# Patient Record
Sex: Male | Born: 1950 | Race: White | Hispanic: No | Marital: Single | Smoking: Never smoker
Health system: Southern US, Community
[De-identification: ages and names within clinical notes are randomized; demographics above are authoritative.]

## PROBLEM LIST (undated history)

## (undated) DIAGNOSIS — R0609 Other forms of dyspnea: Secondary | ICD-10-CM

## (undated) DIAGNOSIS — F5104 Psychophysiologic insomnia: Secondary | ICD-10-CM

## (undated) DIAGNOSIS — C4490 Unspecified malignant neoplasm of skin, unspecified: Secondary | ICD-10-CM

## (undated) DIAGNOSIS — I2510 Atherosclerotic heart disease of native coronary artery without angina pectoris: Secondary | ICD-10-CM

## (undated) DIAGNOSIS — I700 Atherosclerosis of aorta: Secondary | ICD-10-CM

## (undated) DIAGNOSIS — S22000A Wedge compression fracture of unspecified thoracic vertebra, initial encounter for closed fracture: Secondary | ICD-10-CM

## (undated) DIAGNOSIS — H8120 Vestibular neuronitis, unspecified ear: Secondary | ICD-10-CM

## (undated) DIAGNOSIS — M5136 Other intervertebral disc degeneration, lumbar region: Secondary | ICD-10-CM

## (undated) DIAGNOSIS — Z7982 Long term (current) use of aspirin: Secondary | ICD-10-CM

## (undated) DIAGNOSIS — M47816 Spondylosis without myelopathy or radiculopathy, lumbar region: Secondary | ICD-10-CM

## (undated) DIAGNOSIS — C61 Malignant neoplasm of prostate: Secondary | ICD-10-CM

## (undated) DIAGNOSIS — K4090 Unilateral inguinal hernia, without obstruction or gangrene, not specified as recurrent: Secondary | ICD-10-CM

## (undated) DIAGNOSIS — K922 Gastrointestinal hemorrhage, unspecified: Secondary | ICD-10-CM

## (undated) DIAGNOSIS — I209 Angina pectoris, unspecified: Secondary | ICD-10-CM

## (undated) DIAGNOSIS — K219 Gastro-esophageal reflux disease without esophagitis: Secondary | ICD-10-CM

## (undated) DIAGNOSIS — I10 Essential (primary) hypertension: Secondary | ICD-10-CM

## (undated) DIAGNOSIS — E785 Hyperlipidemia, unspecified: Secondary | ICD-10-CM

## (undated) DIAGNOSIS — N529 Male erectile dysfunction, unspecified: Secondary | ICD-10-CM

## (undated) DIAGNOSIS — N419 Inflammatory disease of prostate, unspecified: Secondary | ICD-10-CM

## (undated) DIAGNOSIS — L570 Actinic keratosis: Secondary | ICD-10-CM

## (undated) DIAGNOSIS — R972 Elevated prostate specific antigen [PSA]: Secondary | ICD-10-CM

## (undated) DIAGNOSIS — D649 Anemia, unspecified: Secondary | ICD-10-CM

## (undated) DIAGNOSIS — I6521 Occlusion and stenosis of right carotid artery: Secondary | ICD-10-CM

## (undated) DIAGNOSIS — M51369 Other intervertebral disc degeneration, lumbar region without mention of lumbar back pain or lower extremity pain: Secondary | ICD-10-CM

## (undated) DIAGNOSIS — I4510 Unspecified right bundle-branch block: Secondary | ICD-10-CM

## (undated) DIAGNOSIS — R0600 Dyspnea, unspecified: Secondary | ICD-10-CM

## (undated) HISTORY — PX: OTHER SURGICAL HISTORY: SHX169

## (undated) HISTORY — PX: CARDIAC CATHETERIZATION: SHX172

## (undated) HISTORY — PX: CATARACT EXTRACTION: SUR2

## (undated) HISTORY — PX: RETINAL DETACHMENT SURGERY: SHX105

## (undated) HISTORY — PX: PROSTATECTOMY: SHX69

---
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
## 1998-03-26 DIAGNOSIS — I2510 Atherosclerotic heart disease of native coronary artery without angina pectoris: Secondary | ICD-10-CM

## 1998-03-26 HISTORY — DX: Atherosclerotic heart disease of native coronary artery without angina pectoris: I25.10

## 1998-04-15 HISTORY — PX: CORONARY ANGIOPLASTY WITH STENT PLACEMENT: SHX49

## 2006-06-27 ENCOUNTER — Ambulatory Visit: Payer: Self-pay | Admitting: Cardiology

## 2006-08-09 ENCOUNTER — Ambulatory Visit: Payer: Self-pay | Admitting: Cardiology

## 2010-05-22 ENCOUNTER — Ambulatory Visit: Payer: Self-pay | Admitting: Internal Medicine

## 2010-05-25 ENCOUNTER — Ambulatory Visit: Payer: Self-pay | Admitting: Internal Medicine

## 2010-11-27 ENCOUNTER — Inpatient Hospital Stay: Payer: Self-pay | Admitting: Internal Medicine

## 2010-12-03 LAB — PATHOLOGY REPORT

## 2010-12-31 ENCOUNTER — Ambulatory Visit: Payer: Self-pay | Admitting: Internal Medicine

## 2011-01-25 ENCOUNTER — Ambulatory Visit: Payer: Self-pay | Admitting: Gastroenterology

## 2011-09-28 ENCOUNTER — Ambulatory Visit: Payer: Self-pay | Admitting: Internal Medicine

## 2012-10-12 ENCOUNTER — Emergency Department: Payer: Self-pay | Admitting: Emergency Medicine

## 2013-02-19 ENCOUNTER — Emergency Department: Payer: Self-pay | Admitting: Emergency Medicine

## 2013-02-19 LAB — URINALYSIS, COMPLETE
BILIRUBIN, UR: NEGATIVE
Bacteria: NONE SEEN
Blood: NEGATIVE
Glucose,UR: NEGATIVE mg/dL (ref 0–75)
Hyaline Cast: 11
Ketone: NEGATIVE
Leukocyte Esterase: NEGATIVE
Nitrite: NEGATIVE
PH: 5 (ref 4.5–8.0)
Protein: NEGATIVE
Specific Gravity: 1.018 (ref 1.003–1.030)

## 2013-02-19 LAB — COMPREHENSIVE METABOLIC PANEL
ALBUMIN: 4.4 g/dL (ref 3.4–5.0)
ALT: 45 U/L (ref 12–78)
Alkaline Phosphatase: 71 U/L
Anion Gap: 3 — ABNORMAL LOW (ref 7–16)
BILIRUBIN TOTAL: 0.9 mg/dL (ref 0.2–1.0)
BUN: 21 mg/dL — AB (ref 7–18)
CO2: 29 mmol/L (ref 21–32)
CREATININE: 1.03 mg/dL (ref 0.60–1.30)
Calcium, Total: 9.3 mg/dL (ref 8.5–10.1)
Chloride: 102 mmol/L (ref 98–107)
EGFR (African American): >60
EGFR (Non-African Amer.): >60
GLUCOSE: 105 mg/dL — AB (ref 65–99)
Osmolality: 272 (ref 275–301)
Potassium: 3.9 mmol/L (ref 3.5–5.1)
SGOT(AST): 59 U/L — ABNORMAL HIGH (ref 15–37)
Sodium: 134 mmol/L — ABNORMAL LOW (ref 136–145)
TOTAL PROTEIN: 8 g/dL (ref 6.4–8.2)

## 2013-02-19 LAB — CBC
HCT: 43.1 % (ref 40.0–52.0)
HGB: 14.9 g/dL (ref 13.0–18.0)
MCH: 31.3 pg (ref 26.0–34.0)
MCHC: 34.5 g/dL (ref 32.0–36.0)
MCV: 91 fL (ref 80–100)
Platelet: 162 10*3/uL (ref 150–440)
RBC: 4.74 10*6/uL (ref 4.40–5.90)
RDW: 13.1 % (ref 11.5–14.5)
WBC: 12.9 10*3/uL — AB (ref 3.8–10.6)

## 2013-02-19 LAB — CK TOTAL AND CKMB (NOT AT ARMC)
CK, Total: 227 U/L (ref 35–232)
CK-MB: 1 ng/mL (ref 0.5–3.6)

## 2013-02-19 LAB — TROPONIN I: Troponin-I: <0.02 ng/mL

## 2013-03-30 ENCOUNTER — Ambulatory Visit: Payer: Self-pay | Admitting: Internal Medicine

## 2014-01-31 ENCOUNTER — Ambulatory Visit: Payer: Self-pay | Admitting: Internal Medicine

## 2015-01-26 DIAGNOSIS — C61 Malignant neoplasm of prostate: Secondary | ICD-10-CM

## 2015-01-26 HISTORY — DX: Malignant neoplasm of prostate: C61

## 2016-11-16 ENCOUNTER — Other Ambulatory Visit: Payer: Self-pay | Admitting: Chiropractic Medicine

## 2016-11-16 DIAGNOSIS — M79605 Pain in left leg: Secondary | ICD-10-CM

## 2016-11-16 DIAGNOSIS — M545 Low back pain: Secondary | ICD-10-CM

## 2016-11-19 ENCOUNTER — Ambulatory Visit
Admission: RE | Admit: 2016-11-19 | Discharge: 2016-11-19 | Disposition: A | Discharge status: Home / Self Care | Payer: Medicare Other | Source: Ambulatory Visit | Attending: Chiropractic Medicine | Admitting: Chiropractic Medicine

## 2016-11-19 DIAGNOSIS — M47816 Spondylosis without myelopathy or radiculopathy, lumbar region: Secondary | ICD-10-CM | POA: Diagnosis not present

## 2016-11-19 DIAGNOSIS — M5137 Other intervertebral disc degeneration, lumbosacral region: Secondary | ICD-10-CM | POA: Insufficient documentation

## 2016-11-19 DIAGNOSIS — M545 Low back pain, unspecified: Secondary | ICD-10-CM

## 2016-11-19 DIAGNOSIS — M419 Scoliosis, unspecified: Secondary | ICD-10-CM | POA: Insufficient documentation

## 2018-08-01 ENCOUNTER — Other Ambulatory Visit
Admission: RE | Admit: 2018-08-01 | Discharge: 2018-08-01 | Disposition: A | Discharge status: Home / Self Care | Payer: Medicare Other | Source: Ambulatory Visit | Attending: Gastroenterology | Admitting: Gastroenterology

## 2018-08-01 ENCOUNTER — Other Ambulatory Visit: Payer: Self-pay

## 2018-08-01 DIAGNOSIS — Z01812 Encounter for preprocedural laboratory examination: Secondary | ICD-10-CM | POA: Diagnosis present

## 2018-08-01 DIAGNOSIS — Z1159 Encounter for screening for other viral diseases: Secondary | ICD-10-CM | POA: Insufficient documentation

## 2018-08-01 LAB — SARS CORONAVIRUS 2 (TAT 6-24 HRS): SARS Coronavirus 2: NEGATIVE

## 2018-08-04 ENCOUNTER — Ambulatory Visit: Payer: Medicare Other | Admitting: Anesthesiology

## 2018-08-04 ENCOUNTER — Other Ambulatory Visit: Payer: Self-pay

## 2018-08-04 ENCOUNTER — Encounter: Payer: Self-pay | Admitting: Anesthesiology

## 2018-08-04 ENCOUNTER — Ambulatory Visit
Admission: RE | Admit: 2018-08-04 | Discharge: 2018-08-04 | Disposition: A | Discharge status: Home / Self Care | Payer: Medicare Other | Attending: Gastroenterology | Admitting: Gastroenterology

## 2018-08-04 ENCOUNTER — Encounter
Admission: RE | Disposition: A | Discharge status: Home / Self Care | Payer: Self-pay | Source: Home / Self Care | Attending: Gastroenterology

## 2018-08-04 DIAGNOSIS — Z8711 Personal history of peptic ulcer disease: Secondary | ICD-10-CM | POA: Diagnosis not present

## 2018-08-04 DIAGNOSIS — K210 Gastro-esophageal reflux disease with esophagitis: Secondary | ICD-10-CM | POA: Insufficient documentation

## 2018-08-04 DIAGNOSIS — K2980 Duodenitis without bleeding: Secondary | ICD-10-CM | POA: Insufficient documentation

## 2018-08-04 DIAGNOSIS — K228 Other specified diseases of esophagus: Secondary | ICD-10-CM | POA: Diagnosis not present

## 2018-08-04 DIAGNOSIS — Z79899 Other long term (current) drug therapy: Secondary | ICD-10-CM | POA: Insufficient documentation

## 2018-08-04 DIAGNOSIS — K224 Dyskinesia of esophagus: Secondary | ICD-10-CM | POA: Insufficient documentation

## 2018-08-04 DIAGNOSIS — Z7982 Long term (current) use of aspirin: Secondary | ICD-10-CM | POA: Insufficient documentation

## 2018-08-04 DIAGNOSIS — R0789 Other chest pain: Secondary | ICD-10-CM | POA: Insufficient documentation

## 2018-08-04 DIAGNOSIS — K449 Diaphragmatic hernia without obstruction or gangrene: Secondary | ICD-10-CM | POA: Insufficient documentation

## 2018-08-04 DIAGNOSIS — I10 Essential (primary) hypertension: Secondary | ICD-10-CM | POA: Diagnosis not present

## 2018-08-04 HISTORY — PX: ESOPHAGOGASTRODUODENOSCOPY (EGD) WITH PROPOFOL: SHX5813

## 2018-08-04 SURGERY — ESOPHAGOGASTRODUODENOSCOPY (EGD) WITH PROPOFOL
Anesthesia: General

## 2018-08-04 MED ORDER — SODIUM CHLORIDE 0.9 % IV SOLN
INTRAVENOUS | Status: DC
  Administered 2018-08-04: 07:00:00 1000 mL via INTRAVENOUS

## 2018-08-04 MED ORDER — LIDOCAINE HCL (CARDIAC) PF 100 MG/5ML IV SOSY
PREFILLED_SYRINGE | INTRAVENOUS | Status: DC | PRN
  Administered 2018-08-04: 50 mg via INTRATRACHEAL

## 2018-08-04 MED ORDER — PROPOFOL 500 MG/50ML IV EMUL
INTRAVENOUS | Status: AC
  Filled 2018-08-04: qty 50

## 2018-08-04 MED ORDER — LIDOCAINE HCL (PF) 2 % IJ SOLN
INTRAMUSCULAR | Status: AC
  Filled 2018-08-04: qty 10

## 2018-08-04 MED ORDER — PROPOFOL 10 MG/ML IV BOLUS
INTRAVENOUS | Status: DC | PRN
  Administered 2018-08-04: 100 mg via INTRAVENOUS

## 2018-08-04 MED ORDER — GLYCOPYRROLATE 0.2 MG/ML IJ SOLN
INTRAMUSCULAR | Status: DC | PRN
  Administered 2018-08-04: 0.2 mg via INTRAVENOUS

## 2018-08-04 MED ORDER — PROPOFOL 500 MG/50ML IV EMUL
INTRAVENOUS | Status: DC | PRN
  Administered 2018-08-04: 75 ug/kg/min via INTRAVENOUS

## 2018-08-04 NOTE — H&P (Signed)
Outpatient short stay form Pre-procedure 08/04/2018 7:30 AM Lollie Sails MD  Primary Physician: Ramonita Lab, MD  Reason for visit: Patient is a 68 year old male presenting today for an EGD in regards to a history of atypical chest pain.  He also has a history of GERD.  About 2 months ago he began to have rather acute onset of atypical chest pain.  This at times is very short and sharp other times more extended in the left lower chest.  It does not radiate.  Has had a cardiology evaluation which was negative.  He has been placed on a different PPI and increased dose to twice a day which has not appreciably affected the symptom.  However it seems to be rather musculoskeletal in nature.  It is more present when he lightly lays on his left side.  It does not occur when he lays on his right side or on his back.  He has no dysphagia and his reflux is well controlled.  Does have a history of a gastric ulcer number years ago.  He takes a daily 81 mg aspirin that was held this morning.  He does not take any NSAIDs otherwise or other aspirin products.  History of present illness: As noted above    Current Facility-Administered Medications:  .  0.9 %  sodium chloride infusion, , Intravenous, Continuous, Lollie Sails, MD, Last Rate: 20 mL/hr at 08/04/18 0718, 1,000 mL at 08/04/18 0093  Medications Prior to Admission  Medication Sig Dispense Refill Last Dose  . amLODipine (NORVASC) 5 MG tablet Take 5 mg by mouth daily.   08/04/2018 at 0545  . aspirin EC 81 MG tablet Take 81 mg by mouth daily.   08/03/2018 at Unknown time  . cyclobenzaprine (FLEXERIL) 10 MG tablet Take 10 mg by mouth at bedtime.   08/03/2018 at Unknown time  . losartan-hydrochlorothiazide (HYZAAR) 100-25 MG tablet Take 1 tablet by mouth daily.   08/04/2018 at 0545  . pantoprazole (PROTONIX) 40 MG tablet Take 40 mg by mouth 2 (two) times daily before a meal.   08/03/2018 at Unknown time  . senna (SENOKOT) 8.6 MG tablet Take 1 tablet by mouth  daily.   08/03/2018 at Unknown time  . sildenafil (REVATIO) 20 MG tablet Take 20 mg by mouth 5 (five) times daily.   Past Month at Unknown time  . simvastatin (ZOCOR) 40 MG tablet Take 40 mg by mouth daily.   08/03/2018 at Unknown time  . zolpidem (AMBIEN) 5 MG tablet Take 5 mg by mouth at bedtime as needed for sleep.   08/03/2018 at Unknown time     Not on File   History reviewed. No pertinent past medical history.  Review of systems:      Physical Exam    Heart and lungs: The rate and rhythm without rub or gallop lungs are bilaterally clear    HEENT: Cephalic atraumatic eyes are anicteric    Other:    Pertinant exam for procedure: Soft nontender nondistended bowel sounds positive normoactive.  Palpation to the indicated area in the lower left rib cage does not reproduce symptoms.    Planned proceedures: EGD and indicated procedures. I have discussed the risks benefits and complications of procedures to include not limited to bleeding, infection, perforation and the risk of sedation and the patient wishes to proceed.    Lollie Sails, MD Gastroenterology 08/04/2018  7:30 AM

## 2018-08-04 NOTE — Anesthesia Preprocedure Evaluation (Addendum)
Anesthesia Evaluation  Patient identified by MRN, date of birth, ID band Patient awake    Reviewed: Allergy & Precautions, NPO status , Patient's Chart, lab work & pertinent test results  Airway Mallampati: II  TM Distance: <3 FB     Dental  (+) Caps   Pulmonary    Pulmonary exam normal        Cardiovascular hypertension, Pt. on medications Normal cardiovascular exam     Neuro/Psych    GI/Hepatic Neg liver ROS, GERD  ,  Endo/Other  negative endocrine ROS  Renal/GU negative Renal ROS  negative genitourinary   Musculoskeletal   Abdominal Normal abdominal exam  (+)   Peds negative pediatric ROS (+)  Hematology negative hematology ROS (+)   Anesthesia Other Findings   Reproductive/Obstetrics                            Anesthesia Physical Anesthesia Plan  ASA: II  Anesthesia Plan: General   Post-op Pain Management:    Induction: Intravenous  PONV Risk Score and Plan:   Airway Management Planned: Nasal Cannula  Additional Equipment:   Intra-op Plan:   Post-operative Plan:   Informed Consent: I have reviewed the patients History and Physical, chart, labs and discussed the procedure including the risks, benefits and alternatives for the proposed anesthesia with the patient or authorized representative who has indicated his/her understanding and acceptance.     Dental advisory given  Plan Discussed with: CRNA and Surgeon  Anesthesia Plan Comments:         Anesthesia Quick Evaluation

## 2018-08-04 NOTE — Transfer of Care (Signed)
Immediate Anesthesia Transfer of Care Note  Patient: Terry Navarro.  Procedure(s) Performed: ESOPHAGOGASTRODUODENOSCOPY (EGD) WITH PROPOFOL (N/A )  Patient Location: PACU and Endoscopy Unit  Anesthesia Type:General  Level of Consciousness: drowsy  Airway & Oxygen Therapy: Patient connected to nasal cannula oxygen  Post-op Assessment: Report given to RN and Post -op Vital signs reviewed and stable  Post vital signs: Reviewed and stable  Last Vitals:  Vitals Value Taken Time  BP 103/71 08/04/18 0759  Temp 36.2 C 08/04/18 0759  Pulse 72 08/04/18 0801  Resp 19 08/04/18 0801  SpO2 99 % 08/04/18 0801  Vitals shown include unvalidated device data.  Last Pain:  Vitals:   08/04/18 0759  TempSrc: Tympanic  PainSc: 0-No pain         Complications: No apparent anesthesia complications

## 2018-08-04 NOTE — Op Note (Signed)
[facility] Gastroenterology Patient Name: Terry Navarro Procedure Date: 08/04/2018 7:37 AM MRN: [id number] Account #: [id number] Date of Birth: 11/27/1950 Admit Type: Outpatient Age: 68 Room: [facility] ENDO ROOM 1 Gender: Male Note Status: Finalized Procedure:            Upper GI endoscopy Indications:          Gastro-esophageal reflux disease, Unexplained chest pain Providers:            Lollie Sails, MD Medicines:            Monitored Anesthesia Care Complications:        No immediate complications. Procedure:            Pre-Anesthesia Assessment:                       - ASA Grade Assessment: II - A patient with mild                        systemic disease.                       After obtaining informed consent, the endoscope was                        passed under direct vision. Throughout the procedure,                        the patient's blood pressure, pulse, and oxygen                        saturations were monitored continuously. The Endoscope                        was introduced through the mouth, and advanced to the                        third part of duodenum. The upper GI endoscopy was                        accomplished without difficulty. The patient tolerated                        the procedure well. Findings:      The Z-line was variable. Biopsies were taken with a cold forceps for       histology.      Abnormal motility was noted in the lower third of the esophagus. The       cricopharyngeus was normal. There is spasticity of the esophageal body.       The distal esophagus/lower esophageal sphincter is open. Tertiary       peristaltic waves are noted.      The entire examined stomach was normal. Biopsies were taken with a cold       forceps for histology.      Patchy minimal inflammation characterized by erythema was found in the       duodenal bulb.      The exam of the duodenum was otherwise normal.      The cardia and gastric fundus  were normal on retroflexion.      A small hiatal hernia was found. The Z-line was a variable distance from  incisors; the hiatal hernia was sliding. Impression:           - Z-line variable. Biopsied.                       - Abnormal esophageal motility.                       - Normal stomach. Biopsied.                       - Duodenitis.                       - Small hiatal hernia. Recommendation:       - Use Protonix (pantoprazole) 40 mg PO daily daily.                       - Perform CT scan (computed tomography) of the chest                        with contrast at appointment to be scheduled. Procedure Code(s):    --- Professional ---                       [phone], Esophagogastroduodenoscopy, flexible, transoral;                        with biopsy, single or multiple Diagnosis Code(s):    --- Professional ---                       K22.8, Other specified diseases of esophagus                       K22.4, Dyskinesia of esophagus                       K29.80, Duodenitis without bleeding                       K44.9, Diaphragmatic hernia without obstruction or                        gangrene                       K21.9, Gastro-esophageal reflux disease without                        esophagitis                       R07.9, Chest pain, unspecified CPT copyright 2019 American Medical Association. All rights reserved. The codes documented in this report are preliminary and upon coder review may  be revised to meet current compliance requirements. Lollie Sails, MD 08/04/2018 8:01:19 AM This report has been signed electronically. Number of Addenda: 0 Note Initiated On: 08/04/2018 7:37 AM      [facility]

## 2018-08-04 NOTE — Anesthesia Procedure Notes (Signed)
Performed by: Babs Sciara, CRNA Oxygen Delivery Method: Nasal cannula

## 2018-08-04 NOTE — Anesthesia Post-op Follow-up Note (Signed)
Anesthesia QCDR form completed.        

## 2018-08-07 ENCOUNTER — Encounter: Payer: Self-pay | Admitting: Gastroenterology

## 2018-08-07 LAB — SURGICAL PATHOLOGY

## 2018-08-07 NOTE — Anesthesia Postprocedure Evaluation (Signed)
Anesthesia Post Note  Patient: Terry Navarro.  Procedure(s) Performed: ESOPHAGOGASTRODUODENOSCOPY (EGD) WITH PROPOFOL (N/A )  Patient location during evaluation: Endoscopy Anesthesia Type: General Level of consciousness: awake and alert and oriented Pain management: pain level controlled Vital Signs Assessment: post-procedure vital signs reviewed and stable Respiratory status: spontaneous breathing Cardiovascular status: blood pressure returned to baseline Anesthetic complications: no     Last Vitals:  Vitals:   08/04/18 0809 08/04/18 0819  BP: 115/82 117/79  Pulse:  (!) 59  Resp:  20  Temp:    SpO2:  99%    Last Pain:  Vitals:   08/04/18 0819  TempSrc:   PainSc: 0-No pain                 ,

## 2018-08-24 ENCOUNTER — Other Ambulatory Visit: Payer: Self-pay | Admitting: Gastroenterology

## 2018-08-24 DIAGNOSIS — R0789 Other chest pain: Secondary | ICD-10-CM

## 2018-09-01 ENCOUNTER — Other Ambulatory Visit: Payer: Self-pay

## 2018-09-01 ENCOUNTER — Ambulatory Visit
Admission: RE | Admit: 2018-09-01 | Discharge: 2018-09-01 | Disposition: A | Discharge status: Home / Self Care | Payer: Medicare Other | Source: Ambulatory Visit | Attending: Gastroenterology | Admitting: Gastroenterology

## 2018-09-01 DIAGNOSIS — R0789 Other chest pain: Secondary | ICD-10-CM | POA: Insufficient documentation

## 2018-09-01 MED ORDER — IOHEXOL 300 MG/ML  SOLN
75.0000 mL | Freq: Once | INTRAMUSCULAR | Status: AC | PRN
  Administered 2018-09-01: 75 mL via INTRAVENOUS

## 2019-02-15 DIAGNOSIS — I6521 Occlusion and stenosis of right carotid artery: Secondary | ICD-10-CM

## 2019-02-15 HISTORY — DX: Occlusion and stenosis of right carotid artery: I65.21

## 2020-05-14 ENCOUNTER — Other Ambulatory Visit: Payer: Self-pay | Admitting: Physician Assistant

## 2020-05-14 DIAGNOSIS — M542 Cervicalgia: Secondary | ICD-10-CM

## 2020-05-26 ENCOUNTER — Ambulatory Visit
Admission: RE | Admit: 2020-05-26 | Discharge: 2020-05-26 | Disposition: A | Discharge status: Home / Self Care | Payer: Medicare Other | Source: Ambulatory Visit | Attending: Physician Assistant | Admitting: Physician Assistant

## 2020-05-26 ENCOUNTER — Other Ambulatory Visit: Payer: Self-pay

## 2020-05-26 DIAGNOSIS — M542 Cervicalgia: Secondary | ICD-10-CM | POA: Diagnosis present

## 2021-01-23 ENCOUNTER — Encounter: Payer: Self-pay | Admitting: Emergency Medicine

## 2021-01-23 ENCOUNTER — Other Ambulatory Visit: Payer: Self-pay

## 2021-01-23 ENCOUNTER — Emergency Department
Admission: EM | Admit: 2021-01-23 | Discharge: 2021-01-23 | Disposition: A | Discharge status: Home / Self Care | Payer: Medicare Other | Attending: Emergency Medicine | Admitting: Emergency Medicine

## 2021-01-23 ENCOUNTER — Emergency Department: Payer: Medicare Other

## 2021-01-23 DIAGNOSIS — R42 Dizziness and giddiness: Secondary | ICD-10-CM | POA: Diagnosis not present

## 2021-01-23 DIAGNOSIS — Z8546 Personal history of malignant neoplasm of prostate: Secondary | ICD-10-CM | POA: Diagnosis not present

## 2021-01-23 LAB — CBC WITH DIFFERENTIAL/PLATELET
Abs Immature Granulocytes: 0.03 10*3/uL (ref 0.00–0.07)
Basophils Absolute: 0 10*3/uL (ref 0.0–0.1)
Basophils Relative: 0 %
Eosinophils Absolute: 0 10*3/uL (ref 0.0–0.5)
Eosinophils Relative: 1 %
HCT: 40.7 % (ref 39.0–52.0)
Hemoglobin: 14.1 g/dL (ref 13.0–17.0)
Immature Granulocytes: 0 %
Lymphocytes Relative: 15 %
Lymphs Abs: 1.1 10*3/uL (ref 0.7–4.0)
MCH: 32 pg (ref 26.0–34.0)
MCHC: 34.6 g/dL (ref 30.0–36.0)
MCV: 92.3 fL (ref 80.0–100.0)
Monocytes Absolute: 0.3 10*3/uL (ref 0.1–1.0)
Monocytes Relative: 4 %
Neutro Abs: 5.9 10*3/uL (ref 1.7–7.7)
Neutrophils Relative %: 80 %
Platelets: 164 10*3/uL (ref 150–400)
RBC: 4.41 MIL/uL (ref 4.22–5.81)
RDW: 12 % (ref 11.5–15.5)
WBC: 7.4 10*3/uL (ref 4.0–10.5)
nRBC: 0 % (ref 0.0–0.2)

## 2021-01-23 LAB — COMPREHENSIVE METABOLIC PANEL
ALT: 22 U/L (ref 0–44)
AST: 23 U/L (ref 15–41)
Albumin: 4.5 g/dL (ref 3.5–5.0)
Alkaline Phosphatase: 53 U/L (ref 38–126)
Anion gap: 8 (ref 5–15)
BUN: 23 mg/dL (ref 8–23)
CO2: 28 mmol/L (ref 22–32)
Calcium: 9.6 mg/dL (ref 8.9–10.3)
Chloride: 103 mmol/L (ref 98–111)
Creatinine, Ser: 1.1 mg/dL (ref 0.61–1.24)
GFR, Estimated: >60 mL/min (ref >60)
Glucose, Bld: 115 mg/dL — ABNORMAL HIGH (ref 70–99)
Potassium: 3.7 mmol/L (ref 3.5–5.1)
Sodium: 139 mmol/L (ref 135–145)
Total Bilirubin: 0.8 mg/dL (ref 0.3–1.2)
Total Protein: 7.5 g/dL (ref 6.5–8.1)

## 2021-01-23 LAB — TROPONIN I (HIGH SENSITIVITY): Troponin I (High Sensitivity): 3 ng/L (ref <18)

## 2021-01-23 NOTE — ED Provider Notes (Signed)
[facility] Emergency Department Provider Note  ____________________________________________   Event Date/Time   First MD Initiated Contact with Patient 01/23/21 [phone]     (approximate)  I have reviewed the triage vital signs and the nursing notes.   HISTORY  Chief Complaint Dizziness   HPI Terry Navarro. is a 70 y.o. male with below noted past medical history who presents for assessment of dizziness.  Patient states he first noticed it when he woke up yesterday morning.  He states it is precipitated by any movement of his head or quick movements of his body.  He does not feel dizzy when he is at rest.  He is able to walk without any issues.  He states he had a little bit of posterior headache initially but this is improved.  No earache, sore throat,, neck pain, chest pain, cough, shortness of breath, abdominal pain, vomiting, diarrhea, rash, burning with urination or extremity weakness numbness or tingling.  No recent falls or injuries.  No other acute concerns at this time.  Endorses a very remote history of vertigo but has been at least several decades.         Past Medical History:  Diagnosis Date   Prostate cancer ([place]) 2017   TURP    There are no problems to display for this patient.   Past Surgical History:  Procedure Laterality Date   ESOPHAGOGASTRODUODENOSCOPY (EGD) WITH PROPOFOL N/A 08/04/2018   Procedure: ESOPHAGOGASTRODUODENOSCOPY (EGD) WITH PROPOFOL;  Surgeon: Lollie Sails, MD;  Location: [facility] ENDOSCOPY;  Service: Endoscopy;  Laterality: N/A;    Prior to Admission medications   Medication Sig Start Date End Date Taking? Authorizing Provider  amLODipine (NORVASC) 5 MG tablet Take 5 mg by mouth daily.    [provider]  aspirin EC 81 MG tablet Take 81 mg by mouth daily.    [provider]  cyclobenzaprine (FLEXERIL) 10 MG tablet Take 10 mg by mouth at bedtime.    [provider]   losartan-hydrochlorothiazide (HYZAAR) 100-25 MG tablet Take 1 tablet by mouth daily.    [provider]  pantoprazole (PROTONIX) 40 MG tablet Take 40 mg by mouth 2 (two) times daily before a meal.    [provider]  senna (SENOKOT) 8.6 MG tablet Take 1 tablet by mouth daily.    [provider]  sildenafil (REVATIO) 20 MG tablet Take 20 mg by mouth 5 (five) times daily.    [provider]  simvastatin (ZOCOR) 40 MG tablet Take 40 mg by mouth daily.    [provider]  zolpidem (AMBIEN) 5 MG tablet Take 5 mg by mouth at bedtime as needed for sleep.    [provider]    Allergies Sulfa antibiotics  No family history on file.  Social History Social History   Tobacco Use   Smoking status: Never   Smokeless tobacco: Never  Vaping Use   Vaping Use: Never used  Substance Use Topics   Alcohol use: Never   Drug use: Never    Review of Systems  Review of Systems  Constitutional:  Negative for chills and fever.  HENT:  Negative for sore throat.   Eyes:  Negative for pain.  Respiratory:  Negative for cough and stridor.   Cardiovascular:  Negative for chest pain.  Gastrointestinal:  Negative for vomiting.  Skin:  Negative for rash.  Neurological:  Positive for dizziness. Negative for seizures, loss of consciousness and headaches.  Psychiatric/Behavioral:  Negative for  suicidal ideas.   All other systems reviewed and are negative.    ____________________________________________   PHYSICAL EXAM:  VITAL SIGNS: ED Triage Vitals  Enc Vitals Group     BP 01/23/21 0544 130/80     Pulse Rate 01/23/21 0544 62     Resp 01/23/21 0544 19     Temp 01/23/21 0544 97.9 F (36.6 C)     Temp Source 01/23/21 0544 Oral     SpO2 01/23/21 0544 100 %     Weight 01/23/21 0542 169 lb (76.7 kg)     Height 01/23/21 0542 5\' 10"  (1.778 m)     Head Circumference --      Peak Flow --      Pain Score 01/23/21 0601 0     Pain Loc --      Pain  Edu? --      Excl. in [place]? --    Vitals:   01/23/21 0809 01/23/21 0948  BP: 137/86 130/88  Pulse: (!) 51 65  Resp: 17 16  Temp:    SpO2:  99%   Physical Exam Vitals and nursing note reviewed.  Constitutional:      General: He is not in acute distress.    Appearance: He is well-developed.  HENT:     Head: Normocephalic and atraumatic.     Right Ear: External ear normal.     Left Ear: External ear normal.     Nose: Nose normal.  Eyes:     Conjunctiva/sclera: Conjunctivae normal.  Cardiovascular:     Rate and Rhythm: Normal rate and regular rhythm.     Heart sounds: No murmur heard. Pulmonary:     Effort: Pulmonary effort is normal. No respiratory distress.     Breath sounds: Normal breath sounds.  Abdominal:     Palpations: Abdomen is soft.     Tenderness: There is no abdominal tenderness.  Musculoskeletal:        General: No swelling.     Cervical back: Neck supple.  Skin:    General: Skin is warm and dry.     Capillary Refill: Capillary refill takes less than 2 seconds.  Neurological:     Mental Status: He is alert.  Psychiatric:        Mood and Affect: Mood normal.    Cranial nerves II through XII grossly intact.  No pronator drift.  No finger dysmetria.  Symmetric 5/5 strength of all extremities.  Sensation intact to light touch in all extremities.  Unremarkable unassisted gait.  No nystagmus. ____________________________________________   LABS (all labs ordered are listed, but only abnormal results are displayed)  Labs Reviewed  COMPREHENSIVE METABOLIC PANEL - Abnormal; Notable for the following components:      Result Value   Glucose, Bld 115 (*)    All other components within normal limits  CBC WITH DIFFERENTIAL/PLATELET  URINALYSIS, ROUTINE W REFLEX MICROSCOPIC  TROPONIN I (HIGH SENSITIVITY)  TROPONIN I (HIGH SENSITIVITY)   ____________________________________________  EKG  EKG remarkable for sinus rhythm with sinus arrhythmia and a ventricular  rate of 63, right bundle branch block, normal axis, unremarkable intervals without evidence of acute ischemia or significant arrhythmia. ____________________________________________  RADIOLOGY  ED MD interpretation: CT head shows no evidence of ischemia, hemorrhage, mass-effect, edema, ventriculomegaly or other clear acute intracranial process.  Official radiology report(s): CT Head Wo Contrast  Result Date: 01/23/2021 CLINICAL DATA:  Nonspecific dizziness for 1 day EXAM: CT HEAD WITHOUT CONTRAST TECHNIQUE: Contiguous axial images were obtained from the  base of the skull through the vertex without intravenous contrast. COMPARISON:  03/30/2013 FINDINGS: Brain: No evidence of acute infarction, hemorrhage, hydrocephalus, extra-axial collection or mass lesion/mass effect. Vascular: Atheromatous calcification Skull: Normal. Negative for fracture or focal lesion. Sinuses/Orbits: No acute finding. IMPRESSION: Negative head CT. Electronically Signed   By: Jorje Guild M.D.   On: 01/23/2021 06:59    ____________________________________________   PROCEDURES  Procedure(s) performed (including Critical Care):  Procedures   ____________________________________________   INITIAL IMPRESSION / ASSESSMENT AND PLAN / ED COURSE      Patient presents with above-stated history exam for evaluation of dizziness described above.  Patient afebrile and hemodynamically stable on arrival.  Epley maneuver performed on both sides and following this patient stating he was feeling much better.  CMP without significant electrolyte or metabolic derangements.  CBC is unremarkable.  EKG remarkable for sinus rhythm with sinus arrhythmia and a ventricular rate of 63, right bundle branch block, normal axis, unremarkable intervals without evidence of acute ischemia or significant arrhythmia.  Nonelevated troponin is not suggestive ACS  CT head shows no evidence of ischemia, hemorrhage, mass-effect, edema,  ventriculomegaly or other clear acute intracranial process.  Overall impression is BPPV with low suspicion at this time for CVA, symptomatic arrhythmia, metabolic derangement, deep space infection in the head or neck or other immediate life-threatening process.  Given patient states he is feeling better and only dizzy with rapid movements of his head and has a nonfocal normal neurological exam and normal gait I think he is stable for discharge with outpatient follow-up.  Discussed that he may try the Epley maneuver at home for symptoms return.  Otherwise if there are any new symptoms to get much worse he will need to return to emergency room.  Discharged in stable condition.  Strict return precautions advised and discussed.     ____________________________________________   FINAL CLINICAL IMPRESSION(S) / ED DIAGNOSES  Final diagnoses:  Vertigo    Medications - No data to display   ED Discharge Orders     None        Note:  This document was prepared using Dragon voice recognition software and may include unintentional dictation errors.    Lucrezia Starch, MD 01/23/21 [phone]

## 2021-01-23 NOTE — ED Triage Notes (Signed)
Patient ambulatory to triage with steady gait, without difficulty or distress noted; pt reports since yesterday having dizziness upon awakening; denies hx of same, denies accomp symptoms

## 2021-01-23 NOTE — ED Notes (Signed)
See triage note  presents with dizziness which started yesterday  states room was spinning  was able to go to work yesterday  this am states having same sx's  no fever or n/v or pain

## 2021-02-04 ENCOUNTER — Other Ambulatory Visit: Payer: Self-pay | Admitting: Physician Assistant

## 2021-02-04 DIAGNOSIS — H8110 Benign paroxysmal vertigo, unspecified ear: Secondary | ICD-10-CM

## 2021-02-04 DIAGNOSIS — R42 Dizziness and giddiness: Secondary | ICD-10-CM

## 2021-02-13 ENCOUNTER — Other Ambulatory Visit: Payer: Self-pay

## 2021-02-13 ENCOUNTER — Ambulatory Visit
Admission: RE | Admit: 2021-02-13 | Discharge: 2021-02-13 | Disposition: A | Discharge status: Home / Self Care | Payer: Medicare Other | Source: Ambulatory Visit | Attending: Physician Assistant | Admitting: Physician Assistant

## 2021-02-13 DIAGNOSIS — R42 Dizziness and giddiness: Secondary | ICD-10-CM | POA: Diagnosis present

## 2021-02-13 DIAGNOSIS — H8110 Benign paroxysmal vertigo, unspecified ear: Secondary | ICD-10-CM

## 2021-02-13 MED ORDER — GADOBUTROL 1 MMOL/ML IV SOLN
7.5000 mL | Freq: Once | INTRAVENOUS | Status: AC | PRN
  Administered 2021-02-13: 7.5 mL via INTRAVENOUS

## 2021-03-26 ENCOUNTER — Encounter: Payer: Self-pay | Admitting: Gastroenterology

## 2021-03-26 NOTE — H&P (Signed)
? ?Pre-Procedure H&P ?  ?Patient ID: Terry Navarro. is a 71 y.o. male. ? ?Gastroenterology Provider: [name], DO ? ?Referring Provider: Dr. Doy Hutching ?PCP: Idelle Crouch, MD ? ?Date: 03/27/2021 ? ?HPI ?Mr. Terry Navarro. is a 71 y.o. male who presents today for Colonoscopy for screening colonoscopy. ? ?Patient's last colonoscopy was in 2012 demonstrated internal hemorrhoids and sigmoid diverticulosis.  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  No polyps noted.  No family history of colon cancer or colon polyps. ?Patient with normal bowel movements without melena hematochezia diarrhea or constipation.  No appetite or weight changes.  History of prostate cancer status post prostatectomy. ?Most recent labs: Hemoglobin 13.6 MCV 92 platelets 1-73,000 creatinine 1.1 LFTs within normal limits. ?EGD 2020 with tertiary contractions ?No other acute GI complaints.  Does take 325 mg aspirin daily which was held for this procedure (last dose Wednesday morning) ? ? ?Past Medical History:  ?Diagnosis Date  ? Anemia   ? Coronary artery disease   ? HLD (hyperlipidemia)   ? Hypertension   ? Prostate cancer ([place]) 2017  ? TURP  ? ? ?Past Surgical History:  ?Procedure Laterality Date  ? CARDIAC CATHETERIZATION    ? CATARACT EXTRACTION    ? ESOPHAGOGASTRODUODENOSCOPY (EGD) WITH PROPOFOL N/A 08/04/2018  ? Procedure: ESOPHAGOGASTRODUODENOSCOPY (EGD) WITH PROPOFOL;  Surgeon: Lollie Sails, MD;  Location: [facility] ENDOSCOPY;  Service: Endoscopy;  Laterality: N/A;  ? PROSTATECTOMY    ? stent mid RCA    ? ? ?Family History ?No h/o GI disease or malignancy ? ?Review of Systems  ?Constitutional:  Negative for activity change, appetite change, chills, diaphoresis, fatigue, fever and unexpected weight change.  ?HENT:  Negative for trouble swallowing and voice change.   ?Respiratory:  Negative for shortness of breath and wheezing.   ?Cardiovascular:  Negative for chest pain, palpitations and leg  swelling.  ?Gastrointestinal:  Negative for abdominal distention, abdominal pain, anal bleeding, blood in stool, constipation, diarrhea, nausea and vomiting.  ?Musculoskeletal:  Negative for arthralgias and myalgias.  ?Skin:  Negative for color change and pallor.  ?Neurological:  Negative for dizziness, syncope and weakness.  ?Psychiatric/Behavioral:  Negative for confusion. The patient is not nervous/anxious.   ?All other systems reviewed and are negative.  ? ?Medications ?No current facility-administered medications on file prior to encounter.  ? ?Current Outpatient Medications on File Prior to Encounter  ?Medication Sig Dispense Refill  ? amLODipine (NORVASC) 5 MG tablet Take 5 mg by mouth daily.    ? aspirin EC 81 MG tablet Take 81 mg by mouth daily.    ? cyclobenzaprine (FLEXERIL) 10 MG tablet Take 10 mg by mouth at bedtime.    ? fluticasone (FLONASE) 50 MCG/ACT nasal spray Place into both nostrils daily.    ? losartan-hydrochlorothiazide (HYZAAR) 100-25 MG tablet Take 1 tablet by mouth daily.    ? pantoprazole (PROTONIX) 40 MG tablet Take 40 mg by mouth 2 (two) times daily before a meal.    ? rosuvastatin (CRESTOR) 20 MG tablet Take 20 mg by mouth daily.    ? senna (SENOKOT) 8.6 MG tablet Take 1 tablet by mouth daily.    ? zolpidem (AMBIEN) 5 MG tablet Take 5 mg by mouth at bedtime as needed for sleep.    ? sildenafil (REVATIO) 20 MG tablet Take 20 mg by mouth 5 (five) times daily.    ? simvastatin (ZOCOR) 40 MG tablet Take 40 mg by mouth daily. (Patient  not taking: Reported on 03/27/2021)    ? ? ?Pertinent medications related to GI and procedure were reviewed by me with the patient prior to the procedure ? ? ?Current Facility-Administered Medications:  ?  0.9 %  sodium chloride infusion, , Intravenous, Continuous, [name], DO, Last Rate: 20 mL/hr at 03/27/21 2119, Continued from Pre-op at 03/27/21 0936 ?  ?  ? ?Allergies  ?Allergen Reactions  ? Sulfa Antibiotics   ? ?Allergies were reviewed by me  prior to the procedure ? ?Objective  ? ? ?Vitals:  ? 03/27/21 0915  ?BP: (!) 149/85  ?Pulse: 63  ?Resp: 18  ?Temp: (!) 97.1 ?F (36.2 ?C)  ?TempSrc: Temporal  ?SpO2: 100%  ?Weight: 79.4 kg  ?Height: 5\' 10"  (1.778 m)  ? ? ? ?Physical Exam ?Vitals and nursing note reviewed.  ?Constitutional:   ?   General: He is not in acute distress. ?   Appearance: Normal appearance. He is not ill-appearing, toxic-appearing or diaphoretic.  ?HENT:  ?   Head: Normocephalic and atraumatic.  ?   Nose: Nose normal.  ?   Mouth/Throat:  ?   Mouth: Mucous membranes are moist.  ?   Pharynx: Oropharynx is clear.  ?Eyes:  ?   General: No scleral icterus. ?   Extraocular Movements: Extraocular movements intact.  ?Cardiovascular:  ?   Rate and Rhythm: Normal rate and regular rhythm.  ?   Heart sounds: Normal heart sounds. No murmur heard. ?  No friction rub. No gallop.  ?Pulmonary:  ?   Effort: Pulmonary effort is normal. No respiratory distress.  ?   Breath sounds: Normal breath sounds. No wheezing, rhonchi or rales.  ?Abdominal:  ?   General: Bowel sounds are normal. There is no distension.  ?   Palpations: Abdomen is soft.  ?   Tenderness: There is no abdominal tenderness. There is no guarding or rebound.  ?Musculoskeletal:  ?   Cervical back: Neck supple.  ?   Right lower leg: No edema.  ?   Left lower leg: No edema.  ?Skin: ?   General: Skin is warm and dry.  ?   Coloration: Skin is not jaundiced or pale.  ?Neurological:  ?   General: No focal deficit present.  ?   Mental Status: He is alert and oriented to person, place, and time. Mental status is at baseline.  ?Psychiatric:     ?   Mood and Affect: Mood normal.     ?   Behavior: Behavior normal.     ?   Thought Content: Thought content normal.     ?   Judgment: Judgment normal.  ? ? ? ?Assessment:  ?Mr. Terry Navarro. is a 71 y.o. male  who presents today for Colonoscopy for screening colonoscopy. ? ?Plan:  ?Colonoscopy with possible intervention today ? ?Colonoscopy with possible  biopsy, control of bleeding, polypectomy, and interventions as necessary has been discussed with the patient/patient representative. Informed consent was obtained from the patient/patient representative after explaining the indication, nature, and risks of the procedure including but not limited to death, bleeding, perforation, missed neoplasm/lesions, cardiorespiratory compromise, and reaction to medications. Opportunity for questions was given and appropriate answers were provided. Patient/patient representative has verbalized understanding is amenable to undergoing the procedure. ? ? ?[name], DO  ?[facility] ? ?Portions of the record may have been created with voice recognition software. Occasional wrong-word or 'sound-a-like' substitutions may have occurred due to the inherent limitations of  voice recognition software.  Read the chart carefully and recognize, using context, where substitutions may have occurred. ?

## 2021-03-27 ENCOUNTER — Encounter: Payer: Self-pay | Admitting: Gastroenterology

## 2021-03-27 ENCOUNTER — Other Ambulatory Visit: Payer: Self-pay

## 2021-03-27 ENCOUNTER — Ambulatory Visit: Payer: Medicare Other | Admitting: Anesthesiology

## 2021-03-27 ENCOUNTER — Ambulatory Visit
Admission: RE | Admit: 2021-03-27 | Discharge: 2021-03-27 | Disposition: A | Discharge status: Home / Self Care | Payer: Medicare Other | Attending: Gastroenterology | Admitting: Gastroenterology

## 2021-03-27 ENCOUNTER — Encounter
Admission: RE | Disposition: A | Discharge status: Home / Self Care | Payer: Self-pay | Source: Home / Self Care | Attending: Gastroenterology

## 2021-03-27 DIAGNOSIS — I2510 Atherosclerotic heart disease of native coronary artery without angina pectoris: Secondary | ICD-10-CM | POA: Diagnosis not present

## 2021-03-27 DIAGNOSIS — K635 Polyp of colon: Secondary | ICD-10-CM | POA: Insufficient documentation

## 2021-03-27 DIAGNOSIS — E785 Hyperlipidemia, unspecified: Secondary | ICD-10-CM | POA: Diagnosis not present

## 2021-03-27 DIAGNOSIS — Z79899 Other long term (current) drug therapy: Secondary | ICD-10-CM | POA: Insufficient documentation

## 2021-03-27 DIAGNOSIS — Z955 Presence of coronary angioplasty implant and graft: Secondary | ICD-10-CM | POA: Insufficient documentation

## 2021-03-27 DIAGNOSIS — K5730 Diverticulosis of large intestine without perforation or abscess without bleeding: Secondary | ICD-10-CM | POA: Insufficient documentation

## 2021-03-27 DIAGNOSIS — K640 First degree hemorrhoids: Secondary | ICD-10-CM | POA: Insufficient documentation

## 2021-03-27 DIAGNOSIS — I10 Essential (primary) hypertension: Secondary | ICD-10-CM | POA: Diagnosis not present

## 2021-03-27 DIAGNOSIS — Z1211 Encounter for screening for malignant neoplasm of colon: Secondary | ICD-10-CM | POA: Diagnosis not present

## 2021-03-27 DIAGNOSIS — K219 Gastro-esophageal reflux disease without esophagitis: Secondary | ICD-10-CM | POA: Insufficient documentation

## 2021-03-27 DIAGNOSIS — Z8546 Personal history of malignant neoplasm of prostate: Secondary | ICD-10-CM | POA: Insufficient documentation

## 2021-03-27 HISTORY — DX: Hyperlipidemia, unspecified: E78.5

## 2021-03-27 HISTORY — DX: Essential (primary) hypertension: I10

## 2021-03-27 HISTORY — DX: Anemia, unspecified: D64.9

## 2021-03-27 HISTORY — DX: Atherosclerotic heart disease of native coronary artery without angina pectoris: I25.10

## 2021-03-27 HISTORY — PX: COLONOSCOPY WITH PROPOFOL: SHX5780

## 2021-03-27 SURGERY — COLONOSCOPY WITH PROPOFOL
Anesthesia: General

## 2021-03-27 MED ORDER — SODIUM CHLORIDE 0.9 % IV SOLN: INTRAVENOUS | Status: DC

## 2021-03-27 MED ORDER — PHENYLEPHRINE 40 MCG/ML (10ML) SYRINGE FOR IV PUSH (FOR BLOOD PRESSURE SUPPORT)
PREFILLED_SYRINGE | INTRAVENOUS | Status: DC | PRN
  Administered 2021-03-27 (×2): 160 ug via INTRAVENOUS
  Administered 2021-03-27 (×3): 80 ug via INTRAVENOUS
  Administered 2021-03-27: 160 ug via INTRAVENOUS

## 2021-03-27 MED ORDER — LIDOCAINE HCL (CARDIAC) PF 100 MG/5ML IV SOSY
PREFILLED_SYRINGE | INTRAVENOUS | Status: DC | PRN
  Administered 2021-03-27: 50 mg via INTRAVENOUS

## 2021-03-27 MED ORDER — PROPOFOL 10 MG/ML IV BOLUS
INTRAVENOUS | Status: DC | PRN
  Administered 2021-03-27: 60 mg via INTRAVENOUS
  Administered 2021-03-27: 20 mg via INTRAVENOUS

## 2021-03-27 MED ORDER — EPHEDRINE SULFATE (PRESSORS) 50 MG/ML IJ SOLN
INTRAMUSCULAR | Status: DC | PRN
  Administered 2021-03-27: 5 mg via INTRAVENOUS
  Administered 2021-03-27 (×2): 2.5 mg via INTRAVENOUS
  Administered 2021-03-27: 5 mg via INTRAVENOUS
  Administered 2021-03-27: 10 mg via INTRAVENOUS

## 2021-03-27 MED ORDER — PROPOFOL 500 MG/50ML IV EMUL
INTRAVENOUS | Status: DC | PRN
  Administered 2021-03-27: 140 ug/kg/min via INTRAVENOUS

## 2021-03-27 NOTE — Interval H&P Note (Signed)
History and Physical Interval Note: Preprocedure H&P from 03/27/21 ? was reviewed and there was no interval change after seeing and examining the patient.  Written consent was obtained from the patient after discussion of risks, benefits, and alternatives. Patient has consented to proceed with Colonoscopy with possible intervention ? ? ?03/27/2021 ?9:50 AM ? ?Terry Navarro.  has presented today for surgery, with the diagnosis of Screening.  The various methods of treatment have been discussed with the patient and family. After consideration of risks, benefits and other options for treatment, the patient has consented to  Procedure(s): ?COLONOSCOPY WITH PROPOFOL (N/A) as a surgical intervention.  The patient's history has been reviewed, patient examined, no change in status, stable for surgery.  I have reviewed the patient's chart and labs.  Questions were answered to the patient's satisfaction.   ? ? ?[name] ? ? ?

## 2021-03-27 NOTE — Anesthesia Postprocedure Evaluation (Signed)
Anesthesia Post Note ? ?Patient: Terry Navarro. ? ?Procedure(s) Performed: COLONOSCOPY WITH PROPOFOL ? ?Patient location during evaluation: Endoscopy ?Anesthesia Type: General ?Level of consciousness: awake and alert ?Pain management: pain level controlled ?Vital Signs Assessment: post-procedure vital signs reviewed and stable ?Respiratory status: spontaneous breathing, nonlabored ventilation and respiratory function stable ?Cardiovascular status: blood pressure returned to baseline and stable ?Postop Assessment: no apparent nausea or vomiting ?Anesthetic complications: no ? ? ?No notable events documented. ? ? ?Last Vitals:  ?Vitals:  ? 03/27/21 1041 03/27/21 1051  ?BP: 123/67   ?Pulse: 70 72  ?Resp: 15 (!) 21  ?Temp:    ?SpO2: 100% 100%  ?  ?Last Pain:  ?Vitals:  ? 03/27/21 1051  ?TempSrc:   ?PainSc: 0-No pain  ? ? ?  ?  ?  ?  ?  ?  ? ?[place] Ouch ? ? ? ? ?

## 2021-03-27 NOTE — Anesthesia Procedure Notes (Signed)
Date/Time: 03/27/2021 9:54 AM ?Performed by: Lily Peer, , CRNA ?Pre-anesthesia Checklist: Patient identified, Emergency Drugs available, Suction available, Patient being monitored and Timeout performed ?Patient Re-evaluated:Patient Re-evaluated prior to induction ?Oxygen Delivery Method: Nasal cannula ?Induction Type: IV induction ? ? ? ? ?

## 2021-03-27 NOTE — Transfer of Care (Signed)
Immediate Anesthesia Transfer of Care Note ? ?Patient: Terry Navarro. ? ?Procedure(s) Performed: COLONOSCOPY WITH PROPOFOL ? ?Patient Location: Endoscopy Unit ? ?Anesthesia Type:General ? ?Level of Consciousness: drowsy ? ?Airway & Oxygen Therapy: Patient Spontanous Breathing ? ?Post-op Assessment: Report given to RN and Post -op Vital signs reviewed and stable ? ?Post vital signs: Reviewed and stable ? ?Last Vitals:  ?Vitals Value Taken Time  ?BP 138/68   ?Temp 36.1 ?C 03/27/21 1031  ?Pulse 66 03/27/21 1031  ?Resp 15 03/27/21 1031  ?SpO2 100 % 03/27/21 1031  ? ? ?Last Pain:  ?Vitals:  ? 03/27/21 1031  ?TempSrc: Temporal  ?PainSc:   ?   ? ?  ? ?Complications: No notable events documented. ?

## 2021-03-27 NOTE — Op Note (Signed)
[facility] ?Gastroenterology ?Patient Name: Terry Navarro ?Procedure Date: 03/27/2021 9:34 AM ?MRN: [id number] ?Account #: [id number] ?Date of Birth: [birth year] ?Admit Type: Outpatient ?Age: 71 ?Room: [facility] ENDO ROOM 1 ?Gender: Male ?Note Status: Finalized ?Instrument Name: Colonscope [id number] ?Procedure:             Colonoscopy ?Indications:           Screening for colorectal malignant neoplasm ?Providers:             [name] DO, DO ?Medicines:             Monitored Anesthesia Care ?Complications:         No immediate complications. Estimated blood loss:  ?                       Minimal. ?Procedure:             Pre-Anesthesia Assessment: ?                       - Prior to the procedure, a History and Physical was  ?                       performed, and patient medications and allergies were  ?                       reviewed. The patient is competent. The risks and  ?                       benefits of the procedure and the sedation options and  ?                       risks were discussed with the patient. All questions  ?                       were answered and informed consent was obtained.  ?                       Patient identification and proposed procedure were  ?                       verified by the physician, the nurse, the anesthetist  ?                       and the technician in the endoscopy suite. Mental  ?                       Status Examination: alert and oriented. Airway  ?                       Examination: normal oropharyngeal airway and neck  ?                       mobility. Respiratory Examination: clear to  ?                       auscultation. CV Examination: RRR, no murmurs, no S3  ?                       or S4. Prophylactic Antibiotics: The patient does not  ?  require prophylactic antibiotics. Prior  ?                       Anticoagulants: The patient has taken no previous  ?                       anticoagulant or antiplatelet agents. ASA  Grade  ?                       Assessment: II - A patient with mild systemic disease.  ?                       After reviewing the risks and benefits, the patient  ?                       was deemed in satisfactory condition to undergo the  ?                       procedure. The anesthesia plan was to use monitored  ?                       anesthesia care (MAC). Immediately prior to  ?                       administration of medications, the patient was  ?                       re-assessed for adequacy to receive sedatives. The  ?                       heart rate, respiratory rate, oxygen saturations,  ?                       blood pressure, adequacy of pulmonary ventilation, and  ?                       response to care were monitored throughout the  ?                       procedure. The physical status of the patient was  ?                       re-assessed after the procedure. ?                       After obtaining informed consent, the colonoscope was  ?                       passed under direct vision. Throughout the procedure,  ?                       the patient's blood pressure, pulse, and oxygen  ?                       saturations were monitored continuously. The  ?                       Colonoscope was introduced through the anus and  ?  advanced to the the terminal ileum, with  ?                       identification of the appendiceal orifice and IC  ?                       valve. The colonoscopy was performed without  ?                       difficulty. The patient tolerated the procedure well.  ?                       The quality of the bowel preparation was evaluated  ?                       using the BBPS [facility] Bowel Preparation Scale) with  ?                       scores of: Right Colon = 3, Transverse Colon = 3 and  ?                       Left Colon = 3 (entire mucosa seen well with no  ?                       residual staining, small fragments of stool or opaque  ?                        liquid). The total BBPS score equals 9. The terminal  ?                       ileum, ileocecal valve, appendiceal orifice, and  ?                       rectum were photographed. ?Findings: ?     The perianal and digital rectal examinations were normal. Pertinent  ?     negatives include normal sphincter tone. ?     Multiple small-mouthed diverticula were found in the recto-sigmoid colon  ?     and sigmoid colon. Estimated blood loss: none. ?     Non-bleeding internal hemorrhoids were found during retroflexion. The  ?     hemorrhoids were Grade I (internal hemorrhoids that do not prolapse).  ?     Estimated blood loss: none. ?     Two sessile polyps were found in the descending colon. The polyps were 1  ?     to 2 mm in size. These polyps were removed with a cold biopsy forceps.  ?     Resection and retrieval were complete. Estimated blood loss was minimal. ?     The exam was otherwise without abnormality on direct and retroflexion  ?     views. ?Impression:            - Diverticulosis in the recto-sigmoid colon and in the  ?                       sigmoid colon. ?                       - Non-bleeding internal hemorrhoids. ?                       -  Two 1 to 2 mm polyps in the descending colon,  ?                       removed with a cold biopsy forceps. Resected and  ?                       retrieved. ?                       - The examination was otherwise normal on direct and  ?                       retroflexion views. ?Recommendation:        - Discharge patient to home. ?                       - Resume previous diet. ?                       - No aspirin, ibuprofen, naproxen, or other  ?                       non-steroidal anti-inflammatory drugs for 5 days after  ?                       polyp removal. ?                       - Continue present medications. ?                       - Await pathology results. ?                       - Repeat colonoscopy for surveillance based on  ?                        pathology results. ?                       - Return to referring physician as previously  ?                       scheduled. ?                       - The findings and recommendations were discussed with  ?                       the patient. ?Procedure Code(s):     --- Professional --- ?                       [phone], Colonoscopy, flexible; with biopsy, single or  ?                       multiple ?Diagnosis Code(s):     --- Professional --- ?                       Z12.11, Encounter for screening for malignant neoplasm  ?  of colon ?                       K64.0, First degree hemorrhoids ?                       K63.5, Polyp of colon ?                       K57.30, Diverticulosis of large intestine without  ?                       perforation or abscess without bleeding ?CPT copyright 2019 [employer]. All rights reserved. ?The codes documented in this report are preliminary and upon coder review may  ?be revised to meet current compliance requirements. ?Attending Participation: ?     I personally performed the entire procedure. ?Volney American, DO ?[name] DO, DO ?03/27/2021 10:32:18 AM ?This report has been signed electronically. ?Number of Addenda: 0 ?Note Initiated On: 03/27/2021 9:34 AM ?Scope Withdrawal Time: 0 hours 16 minutes 13 seconds  ?Total Procedure Duration: 0 hours 24 minutes 59 seconds  ?Estimated Blood Loss:  Estimated blood loss was minimal. ?     [facility] ?

## 2021-03-27 NOTE — Anesthesia Preprocedure Evaluation (Addendum)
Anesthesia Evaluation  ?Patient identified by MRN, date of birth, ID band ?Patient awake ? ? ? ?Reviewed: ?Allergy & Precautions, NPO status , Patient's Chart, lab work & pertinent test results ? ?Airway ?Mallampati: II ? ?TM Distance: <3 FB ? ? ? ? Dental ? ?(+) Caps ?  ?Pulmonary ?neg pulmonary ROS,  ?  ?Pulmonary exam normal ? ? ? ? ? ? ? Cardiovascular ?Exercise Tolerance: Good ?hypertension, Pt. on medications ?+ CAD and + Cardiac Stents (stent mid RCA)  ?Normal cardiovascular exam+ dysrhythmias (EKG 12/22: RBBB)  ? ? ?  ?Neuro/Psych ?negative neurological ROS ? negative psych ROS  ? GI/Hepatic ?Neg liver ROS, GERD  Controlled and Medicated,  ?Endo/Other  ?negative endocrine ROS ? Renal/GU ?negative Renal ROS  ?negative genitourinary ?  ?Musculoskeletal ?negative musculoskeletal ROS ?(+)  ? Abdominal ?Normal abdominal exam  (+)   ?Peds ?negative pediatric ROS ?(+)  Hematology ?negative hematology ROS ?(+)   ?Anesthesia Other Findings ? ? Reproductive/Obstetrics ?negative OB ROS ? ?  ? ? ? ? ? ? ? ? ? ? ? ? ? ?  ?  ? ? ? ? ? ? ?Anesthesia Physical ? ?Anesthesia Plan ? ?ASA: II ? ?Anesthesia Plan: General  ? ?Post-op Pain Management:   ? ?Induction: Intravenous ? ?PONV Risk Score and Plan:  ? ?Airway Management Planned: Nasal Cannula and Natural Airway ? ?Additional Equipment:  ? ?Intra-op Plan:  ? ?Post-operative Plan:  ? ?Informed Consent: I have reviewed the patients History and Physical, chart, labs and discussed the procedure including the risks, benefits and alternatives for the proposed anesthesia with the patient or authorized representative who has indicated his/her understanding and acceptance.  ? ? ? ?Dental advisory given ? ?Plan Discussed with: CRNA and Surgeon ? ?Anesthesia Plan Comments:   ? ? ? ? ? ?Anesthesia Quick Evaluation ? ?

## 2021-03-30 ENCOUNTER — Encounter: Payer: Self-pay | Admitting: Gastroenterology

## 2021-03-30 LAB — SURGICAL PATHOLOGY

## 2022-02-18 DIAGNOSIS — R519 Headache, unspecified: Secondary | ICD-10-CM | POA: Diagnosis not present

## 2022-02-18 DIAGNOSIS — R490 Dysphonia: Secondary | ICD-10-CM | POA: Diagnosis not present

## 2022-02-18 DIAGNOSIS — F5101 Primary insomnia: Secondary | ICD-10-CM | POA: Diagnosis not present

## 2022-02-18 DIAGNOSIS — H8120 Vestibular neuronitis, unspecified ear: Secondary | ICD-10-CM | POA: Diagnosis not present

## 2022-03-11 DIAGNOSIS — Z79899 Other long term (current) drug therapy: Secondary | ICD-10-CM | POA: Diagnosis not present

## 2022-03-11 DIAGNOSIS — E782 Mixed hyperlipidemia: Secondary | ICD-10-CM | POA: Diagnosis not present

## 2022-03-11 DIAGNOSIS — Z125 Encounter for screening for malignant neoplasm of prostate: Secondary | ICD-10-CM | POA: Diagnosis not present

## 2022-03-25 DIAGNOSIS — I10 Essential (primary) hypertension: Secondary | ICD-10-CM | POA: Diagnosis not present

## 2022-03-25 DIAGNOSIS — E782 Mixed hyperlipidemia: Secondary | ICD-10-CM | POA: Diagnosis not present

## 2022-03-25 DIAGNOSIS — Z125 Encounter for screening for malignant neoplasm of prostate: Secondary | ICD-10-CM | POA: Diagnosis not present

## 2022-03-25 DIAGNOSIS — Z0000 Encounter for general adult medical examination without abnormal findings: Secondary | ICD-10-CM | POA: Diagnosis not present

## 2022-03-25 DIAGNOSIS — M546 Pain in thoracic spine: Secondary | ICD-10-CM | POA: Diagnosis not present

## 2022-03-25 DIAGNOSIS — F5104 Psychophysiologic insomnia: Secondary | ICD-10-CM | POA: Diagnosis not present

## 2022-03-25 DIAGNOSIS — I2510 Atherosclerotic heart disease of native coronary artery without angina pectoris: Secondary | ICD-10-CM | POA: Diagnosis not present

## 2022-03-25 DIAGNOSIS — Z79899 Other long term (current) drug therapy: Secondary | ICD-10-CM | POA: Diagnosis not present

## 2022-03-25 DIAGNOSIS — N411 Chronic prostatitis: Secondary | ICD-10-CM | POA: Diagnosis not present

## 2022-03-29 ENCOUNTER — Other Ambulatory Visit: Payer: Self-pay | Admitting: Internal Medicine

## 2022-03-29 DIAGNOSIS — S22000A Wedge compression fracture of unspecified thoracic vertebra, initial encounter for closed fracture: Secondary | ICD-10-CM

## 2022-03-31 ENCOUNTER — Ambulatory Visit
Admission: RE | Admit: 2022-03-31 | Discharge: 2022-03-31 | Disposition: A | Discharge status: Home / Self Care | Payer: PPO | Source: Ambulatory Visit | Attending: Internal Medicine | Admitting: Internal Medicine

## 2022-03-31 DIAGNOSIS — S22000A Wedge compression fracture of unspecified thoracic vertebra, initial encounter for closed fracture: Secondary | ICD-10-CM | POA: Diagnosis not present

## 2022-03-31 DIAGNOSIS — M549 Dorsalgia, unspecified: Secondary | ICD-10-CM | POA: Diagnosis not present

## 2022-04-12 ENCOUNTER — Other Ambulatory Visit: Payer: Self-pay

## 2022-04-12 ENCOUNTER — Inpatient Hospital Stay
Admission: RE | Admit: 2022-04-12 | Discharge: 2022-04-12 | Disposition: A | Discharge status: Home / Self Care | Payer: Self-pay | Source: Ambulatory Visit | Attending: Orthopedic Surgery | Admitting: Orthopedic Surgery

## 2022-04-12 DIAGNOSIS — Z049 Encounter for examination and observation for unspecified reason: Secondary | ICD-10-CM

## 2022-04-12 DIAGNOSIS — C4361 Malignant melanoma of right upper limb, including shoulder: Secondary | ICD-10-CM | POA: Diagnosis not present

## 2022-04-13 DIAGNOSIS — Z85820 Personal history of malignant melanoma of skin: Secondary | ICD-10-CM | POA: Diagnosis not present

## 2022-04-13 DIAGNOSIS — L814 Other melanin hyperpigmentation: Secondary | ICD-10-CM | POA: Diagnosis not present

## 2022-04-13 DIAGNOSIS — L570 Actinic keratosis: Secondary | ICD-10-CM | POA: Diagnosis not present

## 2022-04-13 DIAGNOSIS — D492 Neoplasm of unspecified behavior of bone, soft tissue, and skin: Secondary | ICD-10-CM | POA: Diagnosis not present

## 2022-04-13 DIAGNOSIS — L820 Inflamed seborrheic keratosis: Secondary | ICD-10-CM | POA: Diagnosis not present

## 2022-04-13 DIAGNOSIS — L821 Other seborrheic keratosis: Secondary | ICD-10-CM | POA: Diagnosis not present

## 2022-04-13 DIAGNOSIS — L578 Other skin changes due to chronic exposure to nonionizing radiation: Secondary | ICD-10-CM | POA: Diagnosis not present

## 2022-04-13 DIAGNOSIS — D229 Melanocytic nevi, unspecified: Secondary | ICD-10-CM | POA: Diagnosis not present

## 2022-04-27 DIAGNOSIS — M5442 Lumbago with sciatica, left side: Secondary | ICD-10-CM | POA: Diagnosis not present

## 2022-04-29 DIAGNOSIS — R0602 Shortness of breath: Secondary | ICD-10-CM | POA: Diagnosis not present

## 2022-04-29 DIAGNOSIS — I10 Essential (primary) hypertension: Secondary | ICD-10-CM | POA: Diagnosis not present

## 2022-04-29 DIAGNOSIS — R079 Chest pain, unspecified: Secondary | ICD-10-CM | POA: Diagnosis not present

## 2022-04-29 DIAGNOSIS — Z9889 Other specified postprocedural states: Secondary | ICD-10-CM | POA: Diagnosis not present

## 2022-04-29 DIAGNOSIS — E782 Mixed hyperlipidemia: Secondary | ICD-10-CM | POA: Diagnosis not present

## 2022-05-14 DIAGNOSIS — R0789 Other chest pain: Secondary | ICD-10-CM | POA: Diagnosis not present

## 2022-05-14 DIAGNOSIS — R0602 Shortness of breath: Secondary | ICD-10-CM | POA: Diagnosis not present

## 2022-05-21 DIAGNOSIS — Z9889 Other specified postprocedural states: Secondary | ICD-10-CM | POA: Diagnosis not present

## 2022-05-21 DIAGNOSIS — E782 Mixed hyperlipidemia: Secondary | ICD-10-CM | POA: Diagnosis not present

## 2022-05-21 DIAGNOSIS — R0602 Shortness of breath: Secondary | ICD-10-CM | POA: Diagnosis not present

## 2022-05-21 DIAGNOSIS — I10 Essential (primary) hypertension: Secondary | ICD-10-CM | POA: Diagnosis not present

## 2022-07-20 DIAGNOSIS — Z85828 Personal history of other malignant neoplasm of skin: Secondary | ICD-10-CM | POA: Diagnosis not present

## 2022-07-20 DIAGNOSIS — L821 Other seborrheic keratosis: Secondary | ICD-10-CM | POA: Diagnosis not present

## 2022-07-20 DIAGNOSIS — D229 Melanocytic nevi, unspecified: Secondary | ICD-10-CM | POA: Diagnosis not present

## 2022-07-20 DIAGNOSIS — L570 Actinic keratosis: Secondary | ICD-10-CM | POA: Diagnosis not present

## 2022-07-20 DIAGNOSIS — L814 Other melanin hyperpigmentation: Secondary | ICD-10-CM | POA: Diagnosis not present

## 2022-07-20 DIAGNOSIS — L578 Other skin changes due to chronic exposure to nonionizing radiation: Secondary | ICD-10-CM | POA: Diagnosis not present

## 2022-07-20 DIAGNOSIS — Z85820 Personal history of malignant melanoma of skin: Secondary | ICD-10-CM | POA: Diagnosis not present

## 2022-07-30 ENCOUNTER — Emergency Department: Payer: PPO

## 2022-07-30 ENCOUNTER — Emergency Department
Admission: EM | Admit: 2022-07-30 | Discharge: 2022-07-30 | Disposition: A | Discharge status: Home / Self Care | Payer: PPO | Attending: Emergency Medicine | Admitting: Emergency Medicine

## 2022-07-30 ENCOUNTER — Other Ambulatory Visit: Payer: Self-pay

## 2022-07-30 DIAGNOSIS — K769 Liver disease, unspecified: Secondary | ICD-10-CM | POA: Diagnosis not present

## 2022-07-30 DIAGNOSIS — R109 Unspecified abdominal pain: Secondary | ICD-10-CM | POA: Diagnosis present

## 2022-07-30 DIAGNOSIS — K4090 Unilateral inguinal hernia, without obstruction or gangrene, not specified as recurrent: Secondary | ICD-10-CM

## 2022-07-30 DIAGNOSIS — K625 Hemorrhage of anus and rectum: Secondary | ICD-10-CM | POA: Diagnosis not present

## 2022-07-30 DIAGNOSIS — K4091 Unilateral inguinal hernia, without obstruction or gangrene, recurrent: Secondary | ICD-10-CM | POA: Diagnosis not present

## 2022-07-30 DIAGNOSIS — N281 Cyst of kidney, acquired: Secondary | ICD-10-CM | POA: Diagnosis not present

## 2022-07-30 DIAGNOSIS — K5730 Diverticulosis of large intestine without perforation or abscess without bleeding: Secondary | ICD-10-CM | POA: Diagnosis not present

## 2022-07-30 LAB — COMPREHENSIVE METABOLIC PANEL
ALT: 17 U/L (ref 0–44)
AST: 21 U/L (ref 15–41)
Albumin: 4.1 g/dL (ref 3.5–5.0)
Alkaline Phosphatase: 51 U/L (ref 38–126)
Anion gap: 8 (ref 5–15)
BUN: 21 mg/dL (ref 8–23)
CO2: 25 mmol/L (ref 22–32)
Calcium: 9.5 mg/dL (ref 8.9–10.3)
Chloride: 103 mmol/L (ref 98–111)
Creatinine, Ser: 1.04 mg/dL (ref 0.61–1.24)
GFR, Estimated: >60 mL/min (ref >60)
Glucose, Bld: 135 mg/dL — ABNORMAL HIGH (ref 70–99)
Potassium: 3.5 mmol/L (ref 3.5–5.1)
Sodium: 136 mmol/L (ref 135–145)
Total Bilirubin: 1 mg/dL (ref 0.3–1.2)
Total Protein: 6.8 g/dL (ref 6.5–8.1)

## 2022-07-30 LAB — TYPE AND SCREEN
ABO/RH(D): A POS
Antibody Screen: NEGATIVE

## 2022-07-30 LAB — CBC
HCT: 40.5 % (ref 39.0–52.0)
Hemoglobin: 14.1 g/dL (ref 13.0–17.0)
MCH: 31.8 pg (ref 26.0–34.0)
MCHC: 34.8 g/dL (ref 30.0–36.0)
MCV: 91.2 fL (ref 80.0–100.0)
Platelets: 148 10*3/uL — ABNORMAL LOW (ref 150–400)
RBC: 4.44 MIL/uL (ref 4.22–5.81)
RDW: 12.2 % (ref 11.5–15.5)
WBC: 8.2 10*3/uL (ref 4.0–10.5)
nRBC: 0 % (ref 0.0–0.2)

## 2022-07-30 LAB — LIPASE, BLOOD: Lipase: 35 U/L (ref 11–51)

## 2022-07-30 MED ORDER — IOHEXOL 300 MG/ML  SOLN
100.0000 mL | Freq: Once | INTRAMUSCULAR | Status: AC | PRN
  Administered 2022-07-30: 100 mL via INTRAVENOUS

## 2022-07-30 NOTE — ED Provider Notes (Signed)
[facility] Provider Note    Event Date/Time   First MD Initiated Contact with Patient 07/30/22 [phone]     (approximate)   History   Abdominal Pain   HPI  Ugo Bourn. is a 72 y.o. male with a history of hypertension coronary disease and anemia   Acute history and physical from March 3, patient with history of coronary disease hypertension hyperlipidemia.  Had a colonoscopy at that time which showed diverticula in the rectosigmoid colon and nonbleeding internal hemorrhoids.  Also polyps.  For about 1 month now has been noticing an area in his left lower abdomen around the groin that will pop out and become tender and he pushes it back in.  It first started after straining with constipation.  Reports he was trying to have a bowel movement suddenly he felt it come out.  He since then has been able to push it back in.  It has been increasingly sore for the last several days.  The day he had an episode where it was quite sore and had popped out, caused pain but he was able to get it back in.  Reports it still feels sore in the area.  No chest pain or shortness of breath  Additionally he noticed that he had a bowel movement today with blood mixed into it.  Red blood.  He does have a history of hemorrhoids.  He has not had any fevers or chills.  Physical Exam   Triage Vital Signs: ED Triage Vitals  Enc Vitals Group     BP 07/30/22 0749 123/68     Pulse Rate 07/30/22 0749 60     Resp 07/30/22 0749 16     Temp 07/30/22 0747 97.7 F (36.5 C)     Temp src --      SpO2 07/30/22 0749 100 %     Weight 07/30/22 0750 174 lb (78.9 kg)     Height 07/30/22 0750 5\' 10"  (1.778 m)     Head Circumference --      Peak Flow --      Pain Score 07/30/22 0749 7     Pain Loc --      Pain Edu? --      Excl. in GC? --     Most recent vital signs: Vitals:   07/30/22 0749 07/30/22 1150  BP: 123/68 118/77  Pulse: 60 64  Resp: 16 16  Temp:  97.9 F (36.6 C)   SpO2: 100% 99%     General: Awake, no distress.  CV:  Good peripheral perfusion.  Resp:  Normal effort.  Abd:  No distention.  The abdomen is soft nontender nondistended throughout except for slight tenderness to palpation along the left lower quadrant and left inguinal region but no swelling mass induration or overlying erythema is noted.  I evaluated his inguinal canals with digital exam, and no palpable mass or hernia is felt at this time.  He does however report that in the area of his left inguinal canal is where he will oftentimes get the area of swelling Other:  Digital rectal exam performed.  Small amount of stool, negative for blood   ED Results / Procedures / Treatments   Labs (all labs ordered are listed, but only abnormal results are displayed) Labs Reviewed  COMPREHENSIVE METABOLIC PANEL - Abnormal; Notable for the following components:      Result Value   Glucose, Bld 135 (*)    All other components  within normal limits  CBC - Abnormal; Notable for the following components:   Platelets 148 (*)    All other components within normal limits  LIPASE, BLOOD  TYPE AND SCREEN     EKG     RADIOLOGY  CT ABDOMEN PELVIS W CONTRAST  Result Date: 07/30/2022 CLINICAL DATA:  Left lower quadrant abdominal pain. Clinical suspicion for hernia. EXAM: CT ABDOMEN AND PELVIS WITH CONTRAST TECHNIQUE: Multidetector CT imaging of the abdomen and pelvis was performed using the standard protocol following bolus administration of intravenous contrast. RADIATION DOSE REDUCTION: This exam was performed according to the departmental dose-optimization program which includes automated exposure control, adjustment of the mA and/or kV according to patient size and/or use of iterative reconstruction technique. CONTRAST:  OMNIPAQUE IOHEXOL 300 MG/ML  SOLN COMPARISON:  12/31/2010. FINDINGS: Lower chest: Clear lung bases. Hepatobiliary: 4 small low-density liver lesions, largest 2 in the lateral  segment of the left lobe measuring up to 1.1 cm size, consistent with cysts. No other liver abnormality. Normal gallbladder. No bile duct dilation. Pancreas: Unremarkable. No pancreatic ductal dilatation or surrounding inflammatory changes. Spleen: Normal in size without focal abnormality. Adrenals/Urinary Tract: Normal adrenal glands. Kidneys normal in size, orientation and position with symmetric enhancement and excretion. 1.7 cm upper pole left renal cyst. No follow-up indicated. No other renal masses, no stones and no hydronephrosis. Ureters are normal in course and in caliber. Bladder is unremarkable. Stomach/Bowel: Normal stomach. Small bowel and colon are normal in caliber. No wall thickening. No inflammation. Diverticula noted along the sigmoid colon. No evidence of appendicitis. Vascular/Lymphatic: Aortoiliac atherosclerosis. No aneurysm. No enlarged lymph nodes. Reproductive: Status post prostatectomy. Other: Minimal fat containing left inguinal hernia. No other hernia. No ascites. Right-sided scrotal hydrocele. Musculoskeletal: No fracture or acute finding.  No bone lesion. IMPRESSION: 1. Minimal fat containing left inguinal hernia. No bowel enters this. There is no associated inflammation. 2. No acute findings within the abdomen or pelvis. 3. Aortic atherosclerosis. Electronically Signed   By: Amie Portland M.D.   On: 07/30/2022 10:34    CT imaging reviewed by me and discussed with Dr. Aleen Campi who upon discussing clinical exam history and CT findings recommends close outpatient follow-up.  I discussed with the patient this plan, and patient is comfortable with following up in clinic with general surgery.   PROCEDURES:  Critical Care performed: No  Procedures   MEDICATIONS ORDERED IN ED: Medications  iohexol (OMNIPAQUE) 300 MG/ML solution 100 mL (100 mLs Intravenous Contrast Given 07/30/22 1000)     IMPRESSION / MDM / ASSESSMENT AND PLAN / ED COURSE  I reviewed the triage vital signs and  the nursing notes.                              Differential diagnosis includes, but is not limited to, inguinal hernia, ventral hernia, less likely causation such as diverticulitis, colitis, internal hernia, small bowel obstruction etc.  He seems to have a clinical history and exam findings most suggestive of likely intermittently herniating loop of bowel in the left lower quadrant inguinal region.  At the present time he has no clinical findings that would be suggestive of strangulation or incarceration.  He also reports 1 bowel movement with blood present, I do not know that the 2 are necessarily related.  He does not have active blood or demonstratable bleeding on digital rectal exam at this time and does have a known history of hemorrhoids  I suspect possibly a small internal hemorrhoid may be bleeding.  Imaging reviewed, left inguinal hernia containing fat only at this time.  Suspect patient likely having intermittent herniation, has been able to successfully reduce it.  Discussed bowel regimen and careful return precautions around inguinal hernia.  Patient agreeable, also agreeable with plan to follow-up with general surgery, and to monitor any further rectal bleeding.  Return precautions and treatment recommendations and follow-up discussed with the patient who is agreeable with the plan.  Labs interpreted as normal comprehensive metabolic panel and CBC  Patient's presentation is most consistent with acute complicated illness / injury requiring diagnostic workup.      Clinical Course as of 07/30/22 1911  Caleen Essex Jul 30, 2022  1610 Labs reviewed notable for normal comprehensive metabolic panel and lipase.  CBC normal with exception to a very mildly reduced platelet count [MQ]  1138 Discussed with Dr. Aleen Campi, recommend outpatient follow-up.  [MQ]    Clinical Course User Index [MQ] Sharyn Creamer, MD   ----------------------------------------- 11:39 AM on  07/30/2022 ----------------------------------------- Reviewed case and clinical history with Dr. Aleen Campi.  He recommends outpatient follow-up.  In the interim I provided the patient careful return precautions especially those around signs or symptoms of incarcerated or strangulated hernia, and precautions regarding rectal bleeding.  At this juncture I do suspect he most likely has internal hemorrhoid as possible cause for his rectal bleeding especially given the associated constipation that he has been experiencing.  Also recommended bowel regimen.  Patient awake alert, no evidence of acutely incarcerated or strangulated hernia at this time.  No palpable mass noted in the inguinal region at this time and he reports just mild soreness in the area.  Return precautions and treatment recommendations and follow-up discussed with the patient who is agreeable with the plan.   FINAL CLINICAL IMPRESSION(S) / ED DIAGNOSES   Final diagnoses:  Unilateral inguinal hernia without obstruction or gangrene, recurrence not specified  Rectal bleeding     Rx / DC Orders   ED Discharge Orders     None        Note:  This document was prepared using Dragon voice recognition software and may include unintentional dictation errors.   Sharyn Creamer, MD 07/30/22 1911

## 2022-07-30 NOTE — Discharge Instructions (Addendum)
Please follow-up with one of our surgeons.  Please return to the emergency room right away if you are to develop a fever, severe nausea, your pain becomes severe or worsens, you are unable to keep food down, begin vomiting any dark or bloody fluid, you develop any dark or bloody stools, feel dehydrated, or other new concerns or symptoms arise.

## 2022-07-30 NOTE — ED Notes (Signed)
Patient Alert and oriented to baseline. Stable and ambulatory to baseline. Patient verbalized understanding of the discharge instructions.  Patient belongings were taken by the patient.   

## 2022-07-30 NOTE — ED Triage Notes (Signed)
Pt to ED for LLQ pain for the past month, worsening pain this morning. Reports needing to hold stomach when coughing and sneezing, and knot to lower abd that has been "pushing back in". Also reports seeing blood in stool today.

## 2022-08-03 ENCOUNTER — Other Ambulatory Visit: Payer: Self-pay | Admitting: Student

## 2022-08-03 DIAGNOSIS — M5442 Lumbago with sciatica, left side: Secondary | ICD-10-CM | POA: Diagnosis not present

## 2022-08-03 DIAGNOSIS — M47816 Spondylosis without myelopathy or radiculopathy, lumbar region: Secondary | ICD-10-CM | POA: Diagnosis not present

## 2022-08-03 DIAGNOSIS — M5136 Other intervertebral disc degeneration, lumbar region: Secondary | ICD-10-CM | POA: Diagnosis not present

## 2022-08-04 ENCOUNTER — Ambulatory Visit: Payer: PPO | Admitting: Surgery

## 2022-08-04 ENCOUNTER — Telehealth: Payer: Self-pay

## 2022-08-04 ENCOUNTER — Encounter: Payer: Self-pay | Admitting: Surgery

## 2022-08-04 VITALS — BP 146/76 | HR 59 | Temp 98.0°F | Ht 70.0 in | Wt 172.0 lb

## 2022-08-04 DIAGNOSIS — K4090 Unilateral inguinal hernia, without obstruction or gangrene, not specified as recurrent: Secondary | ICD-10-CM

## 2022-08-04 NOTE — Progress Notes (Signed)
08/04/2022  Reason for Visit: Left inguinal hernia  History of Present Illness: Terry Fahr. is a 72 y.o. male presenting for evaluation of a left inguinal hernia.  The patient reports having left groin pain for the last month.  He reports pain that sometimes is at the groin and sometimes radiates towards his back.  Reports also having a bulging area which at times is very sore.  He is able to push the bulging back inside.  He has also noticed some episodes of constipation but he's had no changes in his diet or medications.  He still works and leads an active lifestyle and has noticed that the pain and bulging are affecting that.  He presented to the ED on 07/30/22 and was diagnosed with a left inguinal hernia after having a CT scan of abdomen/pelvis.  I have personally viewed the images and agree with the findings.    Of note, the patient has a history of CAD and had cardiac stent in 2000, and also history of prostate cancer s/p robotic prostatectomy in 2016.  Past Medical History: Past Medical History:  Diagnosis Date   Anemia    Coronary artery disease    HLD (hyperlipidemia)    Hypertension    Prostate cancer (HCC) 2017   TURP     Past Surgical History: Past Surgical History:  Procedure Laterality Date   CARDIAC CATHETERIZATION     CATARACT EXTRACTION     COLONOSCOPY WITH PROPOFOL N/A 03/27/2021   Procedure: COLONOSCOPY WITH PROPOFOL;  Surgeon: Jaynie Collins, DO;  Location: [facility] ENDOSCOPY;  Service: Gastroenterology;  Laterality: N/A;   ESOPHAGOGASTRODUODENOSCOPY (EGD) WITH PROPOFOL N/A 08/04/2018   Procedure: ESOPHAGOGASTRODUODENOSCOPY (EGD) WITH PROPOFOL;  Surgeon: Christena Deem, MD;  Location: [facility] ENDOSCOPY;  Service: Endoscopy;  Laterality: N/A;   PROSTATECTOMY     stent mid RCA      Home Medications: Prior to Admission medications   Medication Sig Start Date End Date Taking? Authorizing Provider  amLODipine (NORVASC) 5 MG tablet Take 5 mg by mouth  daily.    [provider]  aspirin EC 81 MG tablet Take 81 mg by mouth daily.    [provider]  cyclobenzaprine (FLEXERIL) 10 MG tablet Take 10 mg by mouth at bedtime.    [provider]  fluticasone (FLONASE) 50 MCG/ACT nasal spray Place into both nostrils daily.    [provider]  losartan-hydrochlorothiazide (HYZAAR) 100-25 MG tablet Take 1 tablet by mouth daily.    [provider]  pantoprazole (PROTONIX) 40 MG tablet Take 40 mg by mouth 2 (two) times daily before a meal.    [provider]  rosuvastatin (CRESTOR) 20 MG tablet Take 20 mg by mouth daily.    [provider]  senna (SENOKOT) 8.6 MG tablet Take 1 tablet by mouth daily.    [provider]  sildenafil (REVATIO) 20 MG tablet Take 20 mg by mouth 5 (five) times daily.    [provider]  simvastatin (ZOCOR) 40 MG tablet Take 40 mg by mouth daily. Patient not taking: Reported on 03/27/2021    [provider]  zolpidem (AMBIEN) 5 MG tablet Take 5 mg by mouth at bedtime as needed for sleep.    [provider]    Allergies: Allergies  Allergen Reactions   Sulfa Antibiotics     Social History:  reports that he has never smoked. He has never been exposed to tobacco smoke. He has never used smokeless tobacco. He  reports that he does not drink alcohol and does not use drugs.   Family History: No family history on file.  Review of Systems: Review of Systems  Constitutional:  Negative for chills and fever.  HENT:  Negative for hearing loss.   Respiratory:  Negative for shortness of breath.   Cardiovascular:  Negative for chest pain.  Gastrointestinal:  Positive for abdominal pain (left groin). Negative for nausea and vomiting.  Genitourinary:  Negative for dysuria.  Musculoskeletal:  Negative for myalgias.  Skin:  Negative for rash.  Neurological:  Negative for dizziness.  Psychiatric/Behavioral:  Negative for depression.      Physical Exam BP (!) 146/76   Pulse (!) 59   Temp 98 F (36.7 C)   Ht 5\' 10"  (1.778 m)   Wt 172 lb (78 kg)   SpO2 97%   BMI 24.68 kg/m  CONSTITUTIONAL: No acute distress, well-nourished HEENT:  Normocephalic, atraumatic, extraocular motion intact. NECK: Trachea is midline, and there is no jugular venous distension.  RESPIRATORY:  Lungs are clear, and breath sounds are equal bilaterally. Normal respiratory effort without pathologic use of accessory muscles. CARDIOVASCULAR: Heart is regular without murmurs, gallops, or rubs. GI: The abdomen is soft, nondistended, with tenderness to palpation of the left groin.  The patient has a small but reducible inguinal hernia.  No palpable hernia on the right groin or at the umbilicus.  Otherwise he has well-healed laparoscopic incisions from prior cystectomy. MUSCULOSKELETAL:  Normal muscle strength and tone in all four extremities.  No peripheral edema or cyanosis. SKIN: Skin turgor is normal. There are no pathologic skin lesions.  NEUROLOGIC:  Motor and sensation is grossly normal.  Cranial nerves are grossly intact. PSYCH:  Alert and oriented to person, place and time. Affect is normal.  Laboratory Analysis: Labs from 07/30/2022: Sodium 136, potassium 3.5, chloride 103, CO2 25, BUN 21, creatinine 1.04.  LFTs within normal limits.  WBC 8.2, hemoglobin 14.1, hematocrit 40.5, platelets 148.  Imaging: CT abdomen/pelvis on 07/30/2022: IMPRESSION: 1. Minimal fat containing left inguinal hernia. No bowel enters this. There is no associated inflammation. 2. No acute findings within the abdomen or pelvis. 3. Aortic atherosclerosis.  Assessment and Plan: This is a 72 y.o. male with a symptomatic left inguinal hernia.  -- Discussed with the patient the findings on his CT scan showing a left inguinal hernia containing fat.  Part of his sigmoid colon is very near the hernia defect and perhaps this could also be bulging at times.  On exam today, the  hernia is easily reducible and there is no evidence of any incarceration or strangulation.  It is causing symptoms of pain in the groin and affecting some of his work/lifestyle.  He would like to have the hernia repaired so this does not get worse.   -- Discussed with the patient the plan for a robotic assisted left inguinal hernia repair.  Discussed with him that I would be able to evaluate the right groin as well at the time as a precaution, and if any hernia noticeable, would also repair it.  Discussed with him that my biggest concern is with the possible scar tissue that I will encounter during dissection of the left inguinal hernia due to the prostatectomy.  This may prove to be too dense to safely navigate through the medial portion of the dissection and thus may need to convert to open surgery.  He is understanding of this and is ok to continue. --Reviewed the surgery at length with  him including the planned incisions, the risks of bleeding, infection, injury to surrounding structures, that this would be an outpatient surgery, post-operative activity restrictions, pain control, and he's willing to proceed. --Given his cardiac history and that he's on Aspirin 325 mg, will ask for cardiology clearance and the ability to stop his Aspirin prior to surgery.  Tentatively, will schedule for surgery on 08/17/22, with last day of Aspirin on 08/09/22, but will defer to cardiology on final stop date.   --All of his questions have been answered.  I spent 60 minutes dedicated to the care of this patient on the date of this encounter to include pre-visit review of records, face-to-face time with the patient discussing diagnosis and management, and any post-visit coordination of care.   Howie Ill, MD [facility]

## 2022-08-04 NOTE — Patient Instructions (Signed)
You have chose to have your hernia repaired. This will be done by Dr. Piscoya at [facility].  Please see your (blue) Pre-care information that you have been given today. Our surgery scheduler will call you to verify surgery date and to go over information.   You will need to arrange to be out of work for approximately 1-2 weeks and then you may return with a lifting restriction for 4 more weeks. If you have FMLA or Disability paperwork that needs to be filled out, please have your company fax your paperwork to [phone] or you may drop this by either office. This paperwork will be filled out within 3 days after your surgery has been completed.  You may have a bruise in your groin and also swelling and brusing in your testicle area. You may use ice 4-5 times daily for 15-20 minutes each time. Make sure that you place a barrier between you and the ice pack. To decrease the swelling, you may roll up a bath towel and place it vertically in between your thighs with your testicles resting on the towel. You will want to keep this area elevated as much as possible for several days following surgery.    Inguinal Hernia, Adult Muscles help keep everything in the body in its proper place. But if a weak spot in the muscles develops, something can poke through. That is called a hernia. When this happens in the lower part of the belly (abdomen), it is called an inguinal hernia. (It takes its name from a part of the body in this region called the inguinal canal.) A weak spot in the wall of muscles lets some fat or part of the small intestine bulge through. An inguinal hernia can develop at any age. Men get them more often than women. CAUSES  In adults, an inguinal hernia develops over time. It can be triggered by: Suddenly straining the muscles of the lower abdomen. Lifting heavy objects. Straining to have a bowel movement. Difficult bowel movements (constipation) can lead to this. Constant coughing. This may be  caused by smoking or lung disease. Being overweight. Being pregnant. Working at a job that requires long periods of standing or heavy lifting. Having had an inguinal hernia before. One type can be an emergency situation. It is called a strangulated inguinal hernia. It develops if part of the small intestine slips through the weak spot and cannot get back into the abdomen. The blood supply can be cut off. If that happens, part of the intestine may die. This situation requires emergency surgery. SYMPTOMS  Often, a small inguinal hernia has no symptoms. It is found when a healthcare provider does a physical exam. Larger hernias usually have symptoms.  In adults, symptoms may include: A lump in the groin. This is easier to see when the person is standing. It might disappear when lying down. In men, a lump in the scrotum. Pain or burning in the groin. This occurs especially when lifting, straining or coughing. A dull ache or feeling of pressure in the groin. Signs of a strangulated hernia can include: A bulge in the groin that becomes very painful and tender to the touch. A bulge that turns red or purple. Fever, nausea and vomiting. Inability to have a bowel movement or to pass gas. DIAGNOSIS  To decide if you have an inguinal hernia, a healthcare provider will probably do a physical examination. This will include asking questions about any symptoms you have noticed. The healthcare provider might feel   the groin area and ask you to cough. If an inguinal hernia is felt, the healthcare provider may try to slide it back into the abdomen. Usually no other tests are needed. TREATMENT  Treatments can vary. The size of the hernia makes a difference. Options include: Watchful waiting. This is often suggested if the hernia is small and you have had no symptoms. No medical procedure will be done unless symptoms develop. You will need to watch closely for symptoms. If any occur, contact your healthcare  provider right away. Surgery. This is used if the hernia is larger or you have symptoms. Open surgery. This is usually an outpatient procedure (you will not stay overnight in a hospital). An cut (incision) is made through the skin in the groin. The hernia is put back inside the abdomen. The weak area in the muscles is then repaired by herniorrhaphy or hernioplasty. Herniorrhaphy: in this type of surgery, the weak muscles are sewn back together. Hernioplasty: a patch or mesh is used to close the weak area in the abdominal wall. Laparoscopy. In this procedure, a surgeon makes small incisions. A thin tube with a tiny video camera (called a laparoscope) is put into the abdomen. The surgeon repairs the hernia with mesh by looking with the video camera and using two long instruments. HOME CARE INSTRUCTIONS  After surgery to repair an inguinal hernia: You will need to take pain medicine prescribed by your healthcare provider. Follow all directions carefully. You will need to take care of the wound from the incision. Your activity will be restricted for awhile. This will probably include no heavy lifting for several weeks. You also should not do anything too active for a few weeks. When you can return to work will depend on the type of job that you have. During "watchful waiting" periods, you should: Maintain a healthy weight. Eat a diet high in fiber (fruits, vegetables and whole grains). Drink plenty of fluids to avoid constipation. This means drinking enough water and other liquids to keep your urine clear or pale yellow. Do not lift heavy objects. Do not stand for long periods of time. Quit smoking. This should keep you from developing a frequent cough. SEEK MEDICAL CARE IF:  A bulge develops in your groin area. You feel pain, a burning sensation or pressure in the groin. This might be worse if you are lifting or straining. You develop a fever of more than 100.5 F (38.1 C). SEEK IMMEDIATE MEDICAL  CARE IF:  Pain in the groin increases suddenly. A bulge in the groin gets bigger suddenly and does not go down. For men, there is sudden pain in the scrotum. Or, the size of the scrotum increases. A bulge in the groin area becomes red or purple and is painful to touch. You have nausea or vomiting that does not go away. You feel your heart beating much faster than normal. You cannot have a bowel movement or pass gas. You develop a fever of more than 102.0 F (38.9 C).   This information is not intended to replace advice given to you by your health care provider. Make sure you discuss any questions you have with your health care provider.   Document Released: 05/30/2008 Document Revised: 04/05/2011 Document Reviewed: 07/15/2014 Elsevier Interactive Patient Education 2016 [employer]  

## 2022-08-04 NOTE — H&P (View-Only) (Signed)
08/04/2022  Reason for Visit: Left inguinal hernia  History of Present Illness: Terry Navarro. is a 72 y.o. male presenting for evaluation of a left inguinal hernia.  The patient reports having left groin pain for the last month.  He reports pain that sometimes is at the groin and sometimes radiates towards his back.  Reports also having a bulging area which at times is very sore.  He is able to push the bulging back inside.  He has also noticed some episodes of constipation but he's had no changes in his diet or medications.  He still works and leads an active lifestyle and has noticed that the pain and bulging are affecting that.  He presented to the ED on 07/30/22 and was diagnosed with a left inguinal hernia after having a CT scan of abdomen/pelvis.  I have personally viewed the images and agree with the findings.    Of note, the patient has a history of CAD and had cardiac stent in 2000, and also history of prostate cancer s/p robotic prostatectomy in 2016.  Past Medical History: Past Medical History:  Diagnosis Date   Anemia    Coronary artery disease    HLD (hyperlipidemia)    Hypertension    Prostate cancer (HCC) 2017   TURP     Past Surgical History: Past Surgical History:  Procedure Laterality Date   CARDIAC CATHETERIZATION     CATARACT EXTRACTION     COLONOSCOPY WITH PROPOFOL N/A 03/27/2021   Procedure: COLONOSCOPY WITH PROPOFOL;  Surgeon: Jaynie Collins, DO;  Location: [facility] ENDOSCOPY;  Service: Gastroenterology;  Laterality: N/A;   ESOPHAGOGASTRODUODENOSCOPY (EGD) WITH PROPOFOL N/A 08/04/2018   Procedure: ESOPHAGOGASTRODUODENOSCOPY (EGD) WITH PROPOFOL;  Surgeon: Christena Deem, MD;  Location: [facility] ENDOSCOPY;  Service: Endoscopy;  Laterality: N/A;   PROSTATECTOMY     stent mid RCA      Home Medications: Prior to Admission medications   Medication Sig Start Date End Date Taking? Authorizing Provider  amLODipine (NORVASC) 5 MG tablet Take 5 mg by mouth  daily.    [provider]  aspirin EC 81 MG tablet Take 81 mg by mouth daily.    [provider]  cyclobenzaprine (FLEXERIL) 10 MG tablet Take 10 mg by mouth at bedtime.    [provider]  fluticasone (FLONASE) 50 MCG/ACT nasal spray Place into both nostrils daily.    [provider]  losartan-hydrochlorothiazide (HYZAAR) 100-25 MG tablet Take 1 tablet by mouth daily.    [provider]  pantoprazole (PROTONIX) 40 MG tablet Take 40 mg by mouth 2 (two) times daily before a meal.    [provider]  rosuvastatin (CRESTOR) 20 MG tablet Take 20 mg by mouth daily.    [provider]  senna (SENOKOT) 8.6 MG tablet Take 1 tablet by mouth daily.    [provider]  sildenafil (REVATIO) 20 MG tablet Take 20 mg by mouth 5 (five) times daily.    [provider]  simvastatin (ZOCOR) 40 MG tablet Take 40 mg by mouth daily. Patient not taking: Reported on 03/27/2021    [provider]  zolpidem (AMBIEN) 5 MG tablet Take 5 mg by mouth at bedtime as needed for sleep.    [provider]    Allergies: Allergies  Allergen Reactions   Sulfa Antibiotics     Social History:  reports that he has never smoked. He has never been exposed to tobacco smoke. He has never used smokeless tobacco. He  reports that he does not drink alcohol and does not use drugs.   Family History: No family history on file.  Review of Systems: Review of Systems  Constitutional:  Negative for chills and fever.  HENT:  Negative for hearing loss.   Respiratory:  Negative for shortness of breath.   Cardiovascular:  Negative for chest pain.  Gastrointestinal:  Positive for abdominal pain (left groin). Negative for nausea and vomiting.  Genitourinary:  Negative for dysuria.  Musculoskeletal:  Negative for myalgias.  Skin:  Negative for rash.  Neurological:  Negative for dizziness.  Psychiatric/Behavioral:  Negative for depression.      Physical Exam BP (!) 146/76   Pulse (!) 59   Temp 98 F (36.7 C)   Ht 5\' 10"  (1.778 m)   Wt 172 lb (78 kg)   SpO2 97%   BMI 24.68 kg/m  CONSTITUTIONAL: No acute distress, well-nourished HEENT:  Normocephalic, atraumatic, extraocular motion intact. NECK: Trachea is midline, and there is no jugular venous distension.  RESPIRATORY:  Lungs are clear, and breath sounds are equal bilaterally. Normal respiratory effort without pathologic use of accessory muscles. CARDIOVASCULAR: Heart is regular without murmurs, gallops, or rubs. GI: The abdomen is soft, nondistended, with tenderness to palpation of the left groin.  The patient has a small but reducible inguinal hernia.  No palpable hernia on the right groin or at the umbilicus.  Otherwise he has well-healed laparoscopic incisions from prior cystectomy. MUSCULOSKELETAL:  Normal muscle strength and tone in all four extremities.  No peripheral edema or cyanosis. SKIN: Skin turgor is normal. There are no pathologic skin lesions.  NEUROLOGIC:  Motor and sensation is grossly normal.  Cranial nerves are grossly intact. PSYCH:  Alert and oriented to person, place and time. Affect is normal.  Laboratory Analysis: Labs from 07/30/2022: Sodium 136, potassium 3.5, chloride 103, CO2 25, BUN 21, creatinine 1.04.  LFTs within normal limits.  WBC 8.2, hemoglobin 14.1, hematocrit 40.5, platelets 148.  Imaging: CT abdomen/pelvis on 07/30/2022: IMPRESSION: 1. Minimal fat containing left inguinal hernia. No bowel enters this. There is no associated inflammation. 2. No acute findings within the abdomen or pelvis. 3. Aortic atherosclerosis.  Assessment and Plan: This is a 72 y.o. male with a symptomatic left inguinal hernia.  -- Discussed with the patient the findings on his CT scan showing a left inguinal hernia containing fat.  Part of his sigmoid colon is very near the hernia defect and perhaps this could also be bulging at times.  On exam today, the  hernia is easily reducible and there is no evidence of any incarceration or strangulation.  It is causing symptoms of pain in the groin and affecting some of his work/lifestyle.  He would like to have the hernia repaired so this does not get worse.   -- Discussed with the patient the plan for a robotic assisted left inguinal hernia repair.  Discussed with him that I would be able to evaluate the right groin as well at the time as a precaution, and if any hernia noticeable, would also repair it.  Discussed with him that my biggest concern is with the possible scar tissue that I will encounter during dissection of the left inguinal hernia due to the prostatectomy.  This may prove to be too dense to safely navigate through the medial portion of the dissection and thus may need to convert to open surgery.  He is understanding of this and is ok to continue. --Reviewed the surgery at length with  him including the planned incisions, the risks of bleeding, infection, injury to surrounding structures, that this would be an outpatient surgery, post-operative activity restrictions, pain control, and he's willing to proceed. --Given his cardiac history and that he's on Aspirin 325 mg, will ask for cardiology clearance and the ability to stop his Aspirin prior to surgery.  Tentatively, will schedule for surgery on 08/17/22, with last day of Aspirin on 08/09/22, but will defer to cardiology on final stop date.   --All of his questions have been answered.  I spent 60 minutes dedicated to the care of this patient on the date of this encounter to include pre-visit review of records, face-to-face time with the patient discussing diagnosis and management, and any post-visit coordination of care.   Howie Ill, MD [facility]

## 2022-08-04 NOTE — Telephone Encounter (Signed)
Faxed cardiac clearance to Dr. Alexander Paraschos at [phone]. 

## 2022-08-05 ENCOUNTER — Telehealth: Payer: Self-pay

## 2022-08-05 NOTE — Telephone Encounter (Signed)
Transition Care Management Unsuccessful Follow-up Telephone Call  Date of discharge and from where:  [facility] 7/5  Attempts:  1st Attempt  Reason for unsuccessful TCM follow-up call:  No answer/busy   Lenard Forth [employer] Guide, [facility] [phone] [address] [place], [place], [place] [address] Phone: [phone] Email: [email or web address]

## 2022-08-06 ENCOUNTER — Telehealth: Payer: Self-pay

## 2022-08-06 ENCOUNTER — Telehealth: Payer: Self-pay | Admitting: Surgery

## 2022-08-06 ENCOUNTER — Other Ambulatory Visit: Payer: Self-pay

## 2022-08-06 NOTE — Telephone Encounter (Signed)
Transition Care Management Follow-up Telephone Call Date of discharge and from where: [facility] 7/5 How have you been since you were released from the hospital? The best he can he is having surgery on 7/23 Any questions or concerns? No  Items Reviewed: Did the pt receive and understand the discharge instructions provided? Yes  Medications obtained and verified? Yes  Other? No  Any new allergies since your discharge? No  Dietary orders reviewed? No Do you have support at home? Yes    Follow up appointments reviewed:  PCP Hospital f/u appt confirmed? Yes  Scheduled to see  on  @ . Specialist Hospital f/u appt confirmed? Yes  Scheduled to see  on  @ . Are transportation arrangements needed? No  If their condition worsens, is the pt aware to call PCP or go to the Emergency Dept.? Yes Was the patient provided with contact information for the PCP's office or ED? Yes Was to pt encouraged to call back with questions or concerns? Yes

## 2022-08-06 NOTE — Telephone Encounter (Signed)
Patient has been advised of Pre-Admission date/time, and Surgery date at [facility].  Surgery Date: 08/17/22 Preadmission Testing Date: 08/10/22 (phone 1p-4p)  Patient has been made aware to call [phone], between 1-3:00pm the day before surgery, to find out what time to arrive for surgery.    

## 2022-08-06 NOTE — Progress Notes (Unsigned)
Cardiology clearance has been received from Dr Darrold Junker. The patient is cleared at Low risk for surgery. He may hold his Aspirin for 3 days prior to surgery.

## 2022-08-06 NOTE — Telephone Encounter (Signed)
Call to patient regarding his Cardiology Clearance. Patient notified to stop his Aspirin 3 days prior to surgery. His last dose will be on July 19th.

## 2022-08-09 DIAGNOSIS — K5900 Constipation, unspecified: Secondary | ICD-10-CM | POA: Diagnosis not present

## 2022-08-09 DIAGNOSIS — R1032 Left lower quadrant pain: Secondary | ICD-10-CM | POA: Diagnosis not present

## 2022-08-10 ENCOUNTER — Ambulatory Visit
Admission: RE | Admit: 2022-08-10 | Discharge: 2022-08-10 | Disposition: A | Discharge status: Home / Self Care | Payer: PPO | Source: Ambulatory Visit | Attending: Student | Admitting: Student

## 2022-08-10 ENCOUNTER — Encounter: Admission: RE | Admit: 2022-08-10 | Payer: PPO | Source: Ambulatory Visit

## 2022-08-10 ENCOUNTER — Other Ambulatory Visit: Payer: Self-pay

## 2022-08-10 ENCOUNTER — Telehealth: Payer: Self-pay

## 2022-08-10 DIAGNOSIS — M4187 Other forms of scoliosis, lumbosacral region: Secondary | ICD-10-CM | POA: Diagnosis not present

## 2022-08-10 DIAGNOSIS — I10 Essential (primary) hypertension: Secondary | ICD-10-CM | POA: Diagnosis not present

## 2022-08-10 DIAGNOSIS — M47816 Spondylosis without myelopathy or radiculopathy, lumbar region: Secondary | ICD-10-CM | POA: Diagnosis not present

## 2022-08-10 DIAGNOSIS — M5442 Lumbago with sciatica, left side: Secondary | ICD-10-CM | POA: Diagnosis not present

## 2022-08-10 DIAGNOSIS — M5126 Other intervertebral disc displacement, lumbar region: Secondary | ICD-10-CM | POA: Diagnosis not present

## 2022-08-10 DIAGNOSIS — M48061 Spinal stenosis, lumbar region without neurogenic claudication: Secondary | ICD-10-CM | POA: Diagnosis not present

## 2022-08-10 HISTORY — DX: Other intervertebral disc degeneration, lumbar region without mention of lumbar back pain or lower extremity pain: M51.369

## 2022-08-10 HISTORY — DX: Occlusion and stenosis of right carotid artery: I65.21

## 2022-08-10 HISTORY — DX: Inflammatory disease of prostate, unspecified: N41.9

## 2022-08-10 HISTORY — DX: Atherosclerotic heart disease of native coronary artery without angina pectoris: I25.10

## 2022-08-10 HISTORY — DX: Psychophysiologic insomnia: F51.04

## 2022-08-10 HISTORY — DX: Unspecified malignant neoplasm of skin, unspecified: C44.90

## 2022-08-10 HISTORY — DX: Dyspnea, unspecified: R06.00

## 2022-08-10 HISTORY — DX: Actinic keratosis: L57.0

## 2022-08-10 HISTORY — DX: Gastrointestinal hemorrhage, unspecified: K92.2

## 2022-08-10 HISTORY — DX: Vestibular neuronitis, unspecified ear: H81.20

## 2022-08-10 HISTORY — DX: Elevated prostate specific antigen (PSA): R97.20

## 2022-08-10 HISTORY — DX: Angina pectoris, unspecified: I20.9

## 2022-08-10 HISTORY — DX: Other intervertebral disc degeneration, lumbar region: M51.36

## 2022-08-10 HISTORY — DX: Spondylosis without myelopathy or radiculopathy, lumbar region: M47.816

## 2022-08-10 HISTORY — DX: Unilateral inguinal hernia, without obstruction or gangrene, not specified as recurrent: K40.90

## 2022-08-10 HISTORY — DX: Hyperlipidemia, unspecified: E78.5

## 2022-08-10 HISTORY — DX: Gastro-esophageal reflux disease without esophagitis: K21.9

## 2022-08-10 HISTORY — DX: Wedge compression fracture of unspecified thoracic vertebra, initial encounter for closed fracture: S22.000A

## 2022-08-10 HISTORY — DX: Atherosclerosis of aorta: I70.0

## 2022-08-10 NOTE — Patient Instructions (Addendum)
Your procedure is scheduled on: 08/17/22 - Tuesday Report to the Registration Desk on the 1st floor of the [facility]. To find out your arrival time, please call [phone] between 1PM - 3PM on: 08/16/22 - Monday If your arrival time is 6:00 am, do not arrive before that time as the Medical Mall entrance doors do not open until 6:00 am.  REMEMBER: Instructions that are not followed completely may result in serious medical risk, up to and including death; or upon the discretion of your surgeon and anesthesiologist your surgery may need to be rescheduled.  Do not eat food after midnight the night before surgery.  No gum chewing or hard candies.  You may however, drink CLEAR liquids up to 2 hours before you are scheduled to arrive for your surgery. Do not drink anything within 2 hours of your scheduled arrival time.  Clear liquids include: - water  - apple juice without pulp - gatorade (not RED colors) - black coffee or tea (Do NOT add milk or creamers to the coffee or tea) Do NOT drink anything that is not on this list.   One week prior to surgery: Stop Anti-inflammatories (NSAIDS) such as Advil, Aleve, Ibuprofen, Motrin, Naproxen, Naprosyn and Aspirin based products such as Excedrin, Goody's Powder, BC Powder. You may take Tylenol if needed for pain up until the day of surgery.  Stop ANY OVER THE COUNTER supplements until after surgery -  Prevagen  Continue taking all prescribed medications with the exception of the following:  1 . Hold Aspirin beginning 08/09/22. 2.  Sildenafil hold for 2 days    TAKE ONLY THESE MEDICATIONS THE MORNING OF SURGERY WITH A SIP OF WATER:  pantoprazole (PROTONIX)  amLODipine (NORVASC)     No Alcohol for 24 hours before or after surgery.  No Smoking including e-cigarettes for 24 hours before surgery.  No chewable tobacco products for at least 6 hours before surgery.  No nicotine patches on the day of surgery.  Do not use any  "recreational" drugs for at least a week (preferably 2 weeks) before your surgery.  Please be advised that the combination of cocaine and anesthesia may have negative outcomes, up to and including death. If you test positive for cocaine, your surgery will be cancelled.  On the morning of surgery brush your teeth with toothpaste and water, you may rinse your mouth with mouthwash if you wish. Do not swallow any toothpaste or mouthwash.  Use CHG Soap or wipes as directed on instruction sheet.  Do not wear jewelry, make-up, hairpins, clips or nail polish.  Do not wear lotions, powders, or perfumes.   Do not shave body hair from the neck down 48 hours before surgery.  Contact lenses, hearing aids and dentures may not be worn into surgery.  Do not bring valuables to the hospital. [facility] is not responsible for any missing/lost belongings or valuables.   Notify your doctor if there is any change in your medical condition (cold, fever, infection).  Wear comfortable clothing (specific to your surgery type) to the hospital.  After surgery, you can help prevent lung complications by doing breathing exercises.  Take deep breaths and cough every 1-2 hours. Your doctor may order a device called an Incentive Spirometer to help you take deep breaths. When coughing or sneezing, hold a pillow firmly against your incision with both hands. This is called "splinting." Doing this helps protect your incision. It also decreases belly discomfort.  If you are being admitted to  the hospital overnight, leave your suitcase in the car. After surgery it may be brought to your room.  In case of increased patient census, it may be necessary for you, the patient, to continue your postoperative care in the Same Day Surgery department.  If you are being discharged the day of surgery, you will not be allowed to drive home. You will need a responsible individual to drive you home and stay with you for 24 hours after  surgery.   If you are taking public transportation, you will need to have a responsible individual with you.  Please call the Pre-admissions Testing Dept. at [phone] if you have any questions about these instructions.  Surgery Visitation Policy:  Patients having surgery or a procedure may have two visitors.  Children under the age of 2 must have an adult with them who is not the patient.  Inpatient Visitation:    Visiting hours are 7 a.m. to 8 p.m. Up to four visitors are allowed at one time in a patient room. The visitors may rotate out with other people during the day.  One visitor age 63 or older may stay with the patient overnight and must be in the room by 8 p.m.    Preparing for Surgery with CHLORHEXIDINE GLUCONATE (CHG) Soap  Chlorhexidine Gluconate (CHG) Soap  o An antiseptic cleaner that kills germs and bonds with the skin to continue killing germs even after washing  o Used for showering the night before surgery and morning of surgery  Before surgery, you can play an important role by reducing the number of germs on your skin.  CHG (Chlorhexidine gluconate) soap is an antiseptic cleanser which kills germs and bonds with the skin to continue killing germs even after washing.  Please do not use if you have an allergy to CHG or antibacterial soaps. If your skin becomes reddened/irritated stop using the CHG.  1. Shower the NIGHT BEFORE SURGERY and the MORNING OF SURGERY with CHG soap.  2. If you choose to wash your hair, wash your hair first as usual with your normal shampoo.  3. After shampooing, rinse your hair and body thoroughly to remove the shampoo.  4. Use CHG as you would any other liquid soap. You can apply CHG directly to the skin and wash gently with a scrungie or a clean washcloth.  5. Apply the CHG soap to your body only from the neck down. Do not use on open wounds or open sores. Avoid contact with your eyes, ears, mouth, and genitals (private  parts). Wash face and genitals (private parts) with your normal soap.  6. Wash thoroughly, paying special attention to the area where your surgery will be performed.  7. Thoroughly rinse your body with warm water.  8. Do not shower/wash with your normal soap after using and rinsing off the CHG soap.  9. Pat yourself dry with a clean towel.  10. Wear clean pajamas to bed the night before surgery.  12. Place clean sheets on your bed the night of your first shower and do not sleep with pets.  13. Shower again with the CHG soap on the day of surgery prior to arriving at the hospital.  14. Do not apply any deodorants/lotions/powders.  15. Please wear clean clothes to the hospital.

## 2022-08-10 NOTE — Telephone Encounter (Signed)
Received addended clearance from Dr Darrold Junker. The patient may stop their aspirin 5-7 days prior to surgery. Patient notified and will hold aspirin starting today.

## 2022-08-11 ENCOUNTER — Encounter
Admission: RE | Admit: 2022-08-11 | Discharge: 2022-08-11 | Disposition: A | Discharge status: Home / Self Care | Payer: PPO | Source: Ambulatory Visit | Attending: Surgery | Admitting: Surgery

## 2022-08-11 DIAGNOSIS — Z01810 Encounter for preprocedural cardiovascular examination: Secondary | ICD-10-CM | POA: Diagnosis not present

## 2022-08-11 DIAGNOSIS — I10 Essential (primary) hypertension: Secondary | ICD-10-CM | POA: Diagnosis not present

## 2022-08-11 DIAGNOSIS — I4510 Unspecified right bundle-branch block: Secondary | ICD-10-CM | POA: Insufficient documentation

## 2022-08-12 DIAGNOSIS — R202 Paresthesia of skin: Secondary | ICD-10-CM | POA: Diagnosis not present

## 2022-08-12 DIAGNOSIS — F5104 Psychophysiologic insomnia: Secondary | ICD-10-CM | POA: Diagnosis not present

## 2022-08-12 DIAGNOSIS — F5101 Primary insomnia: Secondary | ICD-10-CM | POA: Diagnosis not present

## 2022-08-12 DIAGNOSIS — H8120 Vestibular neuronitis, unspecified ear: Secondary | ICD-10-CM | POA: Diagnosis not present

## 2022-08-16 ENCOUNTER — Encounter: Payer: Self-pay | Admitting: Surgery

## 2022-08-16 NOTE — Progress Notes (Signed)
Perioperative / Anesthesia Services  Pre-Admission Testing Clinical Review / Preoperative Anesthesia Consult  Date: 08/16/22  Patient Demographics:  Name: Terry Navarro. DOB:   September 01, 1950 MRN:   [id number]  Planned Surgical Procedure(s):    Case: [id number] Date/Time: 08/17/22 1322   Procedure: XI ROBOTIC ASSISTED INGUINAL HERNIA REPAIR WITH MESH, possible open (Left)   Anesthesia type: General   Pre-op diagnosis: Left inguinal hernia   Location: [facility] OR ROOM 07 / [facility]   Surgeons: Henrene Dodge, MD     NOTE: Available PAT nursing documentation and vital signs have been reviewed. Clinical nursing staff has updated patient's PMH/PSHx, current medication list, and drug allergies/intolerances to ensure comprehensive history available to assist in medical decision making as it pertains to the aforementioned surgical procedure and anticipated anesthetic course. Extensive review of available clinical information personally performed. [place] PMH and PSHx updated with any diagnoses/procedures that  may have been inadvertently omitted during his intake with the pre-admission testing department's nursing staff.  Clinical Discussion:  Terry Navarro. is a 72 y.o. male who is submitted for pre-surgical anesthesia review and clearance prior to him undergoing the above procedure. Patient has never been a smoker. 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  Patient is followed by cardiology Darrold Junker, MD). He was last seen in the cardiology clinic on 05/21/2022; notes reviewed. At the time of his clinic visit, patient complaining of an approximate 6-week history of new onset exertional chest pain that he described as a "tightness"  sensation with associated shortness of breath.  He denied any PND, orthopnea, palpitations, significant peripheral edema, weakness, fatigue, vertiginous symptoms, or presyncope/syncope. Patient with a past medical history significant for cardiovascular diagnoses. Documented physical exam was grossly benign, providing no evidence of acute exacerbation and/or decompensation of the patient's known cardiovascular conditions.  Of note, complete records regarding patient's cardiovascular history unavailable for review at time of consult.  Information gathered from patient report and from notes provided by his local cardiologist.  Patient reportedly underwent diagnostic LEFT heart catheterization on 04/15/1998.  Official catheterization report unavailable for review.  Patient reportedly underwent PTCA and subsequent PCI of the mid RCA placing 2 stents (type unknown).  Most recent TTE was performed on 05/14/2022 revealing a normal left ventricular systolic function with an EF of >55%.  There were no regional wall motion abnormalities.  Right ventricular size and function normal.  There was trivial to mild mitral, tricuspid, and pulmonary valve regurgitation.  All transvalvular gradients were noted to be normal providing no evidence suggestive of valvular stenosis.  Aorta normal in size with no evidence of aneurysmal dilatation.  Most recent myocardial perfusion imaging study was also performed on 05/24/2022 to further evaluate patient's complaints of chest tightness and shortness of breath.  Study revealed normal left ventricular systolic function with an EF of 58%.  There were no regional wall motion abnormalities.  There was no evidence of stress-induced myocardial ischemia or arrhythmia; no scintigraphic evidence of scar.  Study determined to be normal and low risk.  Blood pressure well controlled at 124/62 mmHg on currently prescribed CCB (amlodipine), ARB (losartan) and diuretic (HCTZ therapies.  Patient is on  rosuvastatin for his HLD diagnosis and ASCVD prevention. Patient is not diabetic.  In the setting of known cardiovascular diagnoses, is  important note the patient is on a PDE5i (sildenafil) for and erectile dysfunction diagnosis.  He does not have an OSAH diagnosis.  Functional capacity somewhat limited by patient's most recent complaints of chest tightness and shortness of breath.  He is still able to complete all of his ADLs/IADLs independently without significant cardiovascular limitation.  With that said, per the DASI, patient is felt to be able to achieve at least 4 METS of physical activity without experiencing a significant degree of angina/anginal equivalent symptoms.  Given the results of his recent noninvasive workup, the decision was made to defer cardiac catheterization opting for continued medical management unless patient's symptoms worsen. No changes were made to his medication regimen during his visit with cardiology.  Patient scheduled to follow-up with outpatient cardiology in 6 months or sooner if needed.  Terry Navarro. is scheduled for an elective XI ROBOTIC ASSISTED INGUINAL HERNIA REPAIR WITH MESH, possible open (Left) on 08/17/2022 with Dr. Ernesto Rutherford, MD.  Given patient's past medical history significant for cardiovascular diagnoses, presurgical cardiac clearance was sought by the PAT team. Per cardiology, "this patient is optimized for surgery and may proceed with the planned procedural course with a LOW risk of significant perioperative cardiovascular complications".  In review of his medication reconciliation, it is noted that patient is currently on prescribed daily antithrombotic therapy. He has been instructed on recommendations for holding his daily full dose ASA for 7 days prior to his procedure with plans to restart as soon as postoperative bleeding risk felt to be minimized by his attending surgeon. The patient has been instructed that his last dose of his ASA should  be on 08/09/2022.  Patient denies previous perioperative complications with anesthesia in the past. In review of the available records, it is noted that patient underwent a general anesthetic course at [facility] ([facility] in 09/2021 without documented complications.      08/04/2022   11:14 AM 07/30/2022   11:50 AM 07/30/2022    7:50 AM  Vitals with BMI  Height 5\' 10"   5\' 10"   Weight 172 lbs  174 lbs  BMI 24.68  24.97  Systolic 146 118   Diastolic 76 77   Pulse 59 64     Providers/Specialists:   NOTE: Primary physician provider listed below. Patient may have been seen by APP or partner within same practice.   PROVIDER ROLE / SPECIALTY LAST Eligha Bridegroom, MD General Surgery (Surgeon) 08/04/2022  Marguarite Arbour, MD Primary Care Provider 08/09/2022  Marcina Millard, MD Cardiology 05/21/2022  Cristopher Peru, MD Neurology 08/12/2022   Allergies:  Sulfa antibiotics  Current Home Medications:   No current facility-administered medications for this encounter.    amLODipine (NORVASC) 5 MG tablet   Apoaequorin (PREVAGEN PO)   aspirin 325 MG tablet   cyclobenzaprine (FLEXERIL) 10 MG tablet   gabapentin (NEURONTIN) 300 MG capsule   losartan-hydrochlorothiazide (HYZAAR) 100-25 MG tablet   pantoprazole (PROTONIX) 40 MG tablet   polyethylene glycol (MIRALAX / GLYCOLAX) 17 g packet   rosuvastatin (CRESTOR) 20 MG tablet   senna (SENOKOT) 8.6 MG tablet   sildenafil (REVATIO) 20 MG tablet   sildenafil (REVATIO) 20 MG tablet   simvastatin (ZOCOR) 40 MG tablet   zolpidem (AMBIEN) 5 MG tablet   History:   Past Medical History:  Diagnosis Date   Actinic keratosis    Anemia    Anginal pain (HCC)    Aortic atherosclerosis (HCC)  Carotid stenosis, right 02/15/2019   a.) carotid doppler 02/15/2019: 50% RICA   Chronic insomnia    a.) on hypnotic (zolpidem) PRN   Coronary artery disease 03/1998   a.) s/p PTCA/PCI 04/15/1998: stents x2  (unknown type) to [facility]; b.) MV 06/08/2018: no ischemia; c.) MV 05/14/2022: no ischemia   DDD (degenerative disc disease), lumbar    Dyspnea on exertion    Elevated prostate specific antigen (PSA)    Erectile dysfunction    a.) on PDE5i (sildenafil)   GERD (gastroesophageal reflux disease)    Hyperlipemia    Hypertension    Left inguinal hernia    Long term current use of aspirin    Lumbar spondylosis    Prostate cancer (HCC) 2017   a.) s/p TURP   Prostatitis    RBBB    Skin cancer    Thoracic compression fracture (HCC)    Upper GI bleed    Vestibular neuritis    suspected   Past Surgical History:  Procedure Laterality Date   CATARACT EXTRACTION     COLONOSCOPY WITH PROPOFOL N/A 03/27/2021   Procedure: COLONOSCOPY WITH PROPOFOL;  Surgeon: Jaynie Collins, DO;  Location: [facility] ENDOSCOPY;  Service: Gastroenterology;  Laterality: N/A;   CORONARY ANGIOPLASTY WITH STENT PLACEMENT Left 04/15/1998   ESOPHAGOGASTRODUODENOSCOPY (EGD) WITH PROPOFOL N/A 08/04/2018   Procedure: ESOPHAGOGASTRODUODENOSCOPY (EGD) WITH PROPOFOL;  Surgeon: Christena Deem, MD;  Location: [facility] ENDOSCOPY;  Service: Endoscopy;  Laterality: N/A;   PROSTATECTOMY     RETINAL DETACHMENT SURGERY     No family history on file. Social History   Tobacco Use   Smoking status: Never    Passive exposure: Never   Smokeless tobacco: Never  Vaping Use   Vaping status: Never Used  Substance Use Topics   Alcohol use: Never   Drug use: Never    Pertinent Clinical Results:  LABS:   No visits with results within 3 Day(s) from this visit.  Latest known visit with results is:  Admission on 07/30/2022, Discharged on 07/30/2022  Component Date Value Ref Range Status   Lipase 07/30/2022 35  11 - 51 U/L Final   Performed at [facility], [address] [place], [place] [address]   Sodium 07/30/2022 136  135 - 145 mmol/L Final   Potassium 07/30/2022 3.5  3.5 - 5.1 mmol/L Final   Chloride 07/30/2022 103   98 - 111 mmol/L Final   CO2 07/30/2022 25  22 - 32 mmol/L Final   Glucose, Bld 07/30/2022 135 (H)  70 - 99 mg/dL Final   Glucose reference range applies only to samples taken after fasting for at least 8 hours.   BUN 07/30/2022 21  8 - 23 mg/dL Final   Creatinine, Ser 07/30/2022 1.04  0.61 - 1.24 mg/dL Final   Calcium 24/40/1027 9.5  8.9 - 10.3 mg/dL Final   Total Protein 25/36/6440 6.8  6.5 - 8.1 g/dL Final   Albumin 34/74/2595 4.1  3.5 - 5.0 g/dL Final   AST 63/87/5643 21  15 - 41 U/L Final   ALT 07/30/2022 17  0 - 44 U/L Final   Alkaline Phosphatase 07/30/2022 51  38 - 126 U/L Final   Total Bilirubin 07/30/2022 1.0  0.3 - 1.2 mg/dL Final   GFR, Estimated 07/30/2022 >60  >60 mL/min Final   Comment: (NOTE) Calculated using the CKD-EPI Creatinine Equation (2021)    Anion gap 07/30/2022 8  5 - 15 Final   Performed at [facility], [address] [place], [place] [address]   WBC 07/30/2022 8.2  4.0 - 10.5 K/uL Final   RBC 07/30/2022 4.44  4.22 - 5.81 MIL/uL Final   Hemoglobin 07/30/2022 14.1  13.0 - 17.0 g/dL Final   HCT 37/10/6267 40.5  39.0 - 52.0 % Final   MCV 07/30/2022 91.2  80.0 - 100.0 fL Final   MCH 07/30/2022 31.8  26.0 - 34.0 pg Final   MCHC 07/30/2022 34.8  30.0 - 36.0 g/dL Final   RDW 48/54/6270 12.2  11.5 - 15.5 % Final   Platelets 07/30/2022 148 (L)  150 - 400 K/uL Final   nRBC 07/30/2022 0.0  0.0 - 0.2 % Final   Performed at [facility], [address] [place], [place] [address]   ABO/RH(D) 07/30/2022 A POS   Final   Antibody Screen 07/30/2022 NEG   Final   Sample Expiration 07/30/2022    Final                   Value:08/02/2022,2359 Performed at [facility], [address] [place], [place] [address]     ECG: Date: 08/11/2022 Time ECG obtained: 0828 AM Rate: 62 bpm Rhythm:  Normal sinus rhythm; RBBB Axis (leads I and aVF): Normal Intervals: PR 170 ms. QRS 130 ms. QTc 440 ms. ST segment and T wave changes: No evidence of  acute ST segment elevation or depression Comparison: Similar to previous tracing obtained on 01/23/2021   IMAGING / PROCEDURES: CT ABDOMEN PELVIS W CONTRAST performed on 07/30/2022 Minimal fat containing left inguinal hernia. No bowel enters this. There is no associated inflammation. No acute findings within the abdomen or pelvis. Aortic atherosclerosis.  MYOCARDIAL PERFUSION IMAGING STUDY (LEXISCAN) performed on 05/14/2022 Normal left ventricular systolic function with an EF of 58% No regional wall motion abnormalities No evidence of stress-induced myocardial ischemia or arrhythmia; no scintigraphic evidence of scar Normal low risk study  TRANSTHORACIC ECHOCARDIOGRAM performed on 05/14/2022 Normal left ventricular systolic function with an EF of >55% No regional wall motion abnormalities Right ventricular size and function normal Trivial mitral and tricuspid valve regurgitation Mild pulmonary valve regurgitation Normal gradients; no valvular stenosis No pericardial effusion  MR THORACIC SPINE WO CONTRAST performed on 03/31/2022 Chronic T9 compression fracture. No acute fracture. Multiple disc herniations, most notably at T9-10 and T10-11 with slight cord deformity and up to mild spinal stenosis.  Impression and Plan:  Terry Navarro. has been referred for pre-anesthesia review and clearance prior to him undergoing the planned anesthetic and procedural courses. Available labs, pertinent testing, and imaging results were personally reviewed by me in preparation for upcoming operative/procedural course. [facility] medical record has been updated following extensive record review and patient interview with PAT staff.   This patient has been appropriately cleared by cardiology with an overall LOW risk of experiencing significant perioperative cardiovascular complications. Based on clinical review performed today (08/16/22), barring any significant acute changes in the patient's  overall condition, it is anticipated that he will be able to proceed with the planned surgical intervention. Any acute changes in clinical condition may necessitate his procedure being postponed and/or cancelled. Patient will meet with anesthesia team (MD and/or CRNA) on the day of his procedure for preoperative evaluation/assessment. Questions regarding anesthetic course will be fielded at that time.   Pre-surgical instructions were reviewed with the patient during his PAT appointment, and questions were fielded to satisfaction by PAT clinical staff. He has been instructed on which medications that he will need  to hold prior to surgery, as well as the ones that have been deemed safe/appropriate to take on the day of his procedure. As part of the general education provided by PAT, patient made aware both verbally and in writing, that he would need to abstain from the use of any illegal substances during his perioperative course.  He was advised that failure to follow the provided instructions could necessitate case cancellation or result in serious perioperative complications up to and including death. Patient encouraged to contact PAT and/or his surgeon's office to discuss any questions or concerns that may arise prior to surgery; verbalized understanding.   Quentin Mulling, MSN, APRN, FNP-C, CEN [facility]  Peri-operative Services Nurse Practitioner Phone: [phone] Fax: [phone] 08/16/22 9:14 AM  NOTE: This note has been prepared using Dragon dictation software. Despite my best ability to proofread, there is always the potential that unintentional transcriptional errors may still occur from this process.

## 2022-08-17 ENCOUNTER — Ambulatory Visit
Admission: RE | Admit: 2022-08-17 | Discharge: 2022-08-17 | Disposition: A | Discharge status: Home / Self Care | Payer: PPO | Attending: Surgery | Admitting: Surgery

## 2022-08-17 ENCOUNTER — Ambulatory Visit: Payer: PPO | Admitting: Urgent Care

## 2022-08-17 ENCOUNTER — Encounter
Admission: RE | Disposition: A | Discharge status: Home / Self Care | Payer: Self-pay | Source: Home / Self Care | Attending: Surgery

## 2022-08-17 ENCOUNTER — Other Ambulatory Visit: Payer: Self-pay

## 2022-08-17 ENCOUNTER — Encounter: Payer: Self-pay | Admitting: Surgery

## 2022-08-17 DIAGNOSIS — Z955 Presence of coronary angioplasty implant and graft: Secondary | ICD-10-CM | POA: Insufficient documentation

## 2022-08-17 DIAGNOSIS — Z8546 Personal history of malignant neoplasm of prostate: Secondary | ICD-10-CM | POA: Diagnosis not present

## 2022-08-17 DIAGNOSIS — Z9079 Acquired absence of other genital organ(s): Secondary | ICD-10-CM | POA: Diagnosis not present

## 2022-08-17 DIAGNOSIS — K4090 Unilateral inguinal hernia, without obstruction or gangrene, not specified as recurrent: Secondary | ICD-10-CM | POA: Diagnosis not present

## 2022-08-17 DIAGNOSIS — E785 Hyperlipidemia, unspecified: Secondary | ICD-10-CM | POA: Diagnosis not present

## 2022-08-17 HISTORY — DX: Male erectile dysfunction, unspecified: N52.9

## 2022-08-17 HISTORY — DX: Long term (current) use of aspirin: Z79.82

## 2022-08-17 HISTORY — DX: Unspecified right bundle-branch block: I45.10

## 2022-08-17 HISTORY — DX: Other forms of dyspnea: R06.09

## 2022-08-17 HISTORY — PX: XI ROBOTIC ASSISTED INGUINAL HERNIA REPAIR WITH MESH: SHX6706

## 2022-08-17 SURGERY — REPAIR, HERNIA, INGUINAL, ROBOT-ASSISTED, LAPAROSCOPIC, USING MESH
Anesthesia: General | Site: Inguinal | Laterality: Left

## 2022-08-17 MED ORDER — KETAMINE HCL 10 MG/ML IJ SOLN
INTRAMUSCULAR | Status: DC | PRN
  Administered 2022-08-17: 30 mg via INTRAVENOUS
  Administered 2022-08-17: 10 mg via INTRAVENOUS

## 2022-08-17 MED ORDER — GABAPENTIN 300 MG PO CAPS
ORAL_CAPSULE | ORAL | Status: AC
  Filled 2022-08-17: qty 1

## 2022-08-17 MED ORDER — CEFAZOLIN SODIUM-DEXTROSE 2-4 GM/100ML-% IV SOLN
INTRAVENOUS | Status: AC
  Filled 2022-08-17: qty 100

## 2022-08-17 MED ORDER — OXYCODONE HCL 5 MG PO TABS
ORAL_TABLET | ORAL | Status: AC
  Filled 2022-08-17: qty 1

## 2022-08-17 MED ORDER — CHLORHEXIDINE GLUCONATE CLOTH 2 % EX PADS: 6.0000 | MEDICATED_PAD | Freq: Once | CUTANEOUS | Status: DC

## 2022-08-17 MED ORDER — HYDROMORPHONE HCL 1 MG/ML IJ SOLN
INTRAMUSCULAR | Status: AC
  Filled 2022-08-17: qty 1

## 2022-08-17 MED ORDER — PROPOFOL 10 MG/ML IV BOLUS
INTRAVENOUS | Status: DC | PRN
  Administered 2022-08-17: 150 mg via INTRAVENOUS

## 2022-08-17 MED ORDER — DEXAMETHASONE SODIUM PHOSPHATE 10 MG/ML IJ SOLN
INTRAMUSCULAR | Status: DC | PRN
  Administered 2022-08-17: 10 mg via INTRAVENOUS

## 2022-08-17 MED ORDER — BUPIVACAINE LIPOSOME 1.3 % IJ SUSP: 20.0000 mL | Freq: Once | INTRAMUSCULAR | Status: DC

## 2022-08-17 MED ORDER — OXYCODONE HCL 5 MG PO TABS: 5.0000 mg | ORAL_TABLET | ORAL | 0 refills | Status: DC | PRN

## 2022-08-17 MED ORDER — ROCURONIUM BROMIDE 100 MG/10ML IV SOLN
INTRAVENOUS | Status: DC | PRN
  Administered 2022-08-17: 50 mg via INTRAVENOUS
  Administered 2022-08-17: 10 mg via INTRAVENOUS

## 2022-08-17 MED ORDER — CEFAZOLIN SODIUM-DEXTROSE 2-4 GM/100ML-% IV SOLN
2.0000 g | INTRAVENOUS | Status: AC
  Administered 2022-08-17: 2 g via INTRAVENOUS

## 2022-08-17 MED ORDER — FENTANYL CITRATE (PF) 100 MCG/2ML IJ SOLN
INTRAMUSCULAR | Status: DC | PRN
  Administered 2022-08-17: 100 ug via INTRAVENOUS

## 2022-08-17 MED ORDER — EPINEPHRINE PF 1 MG/ML IJ SOLN
INTRAMUSCULAR | Status: AC
  Filled 2022-08-17: qty 1

## 2022-08-17 MED ORDER — BUPIVACAINE LIPOSOME 1.3 % IJ SUSP
INTRAMUSCULAR | Status: AC
  Filled 2022-08-17: qty 20

## 2022-08-17 MED ORDER — ACETAMINOPHEN 500 MG PO TABS
ORAL_TABLET | ORAL | Status: AC
  Filled 2022-08-17: qty 2

## 2022-08-17 MED ORDER — CHLORHEXIDINE GLUCONATE 0.12 % MT SOLN
OROMUCOSAL | Status: AC
  Filled 2022-08-17: qty 15

## 2022-08-17 MED ORDER — ONDANSETRON HCL 4 MG/2ML IJ SOLN
INTRAMUSCULAR | Status: DC | PRN
  Administered 2022-08-17: 4 mg via INTRAVENOUS

## 2022-08-17 MED ORDER — KETAMINE HCL 50 MG/5ML IJ SOSY
PREFILLED_SYRINGE | INTRAMUSCULAR | Status: AC
  Filled 2022-08-17: qty 5

## 2022-08-17 MED ORDER — ORAL CARE MOUTH RINSE: 15.0000 mL | Freq: Once | OROMUCOSAL | Status: AC

## 2022-08-17 MED ORDER — OXYCODONE HCL 5 MG PO TABS
5.0000 mg | ORAL_TABLET | Freq: Once | ORAL | Status: AC | PRN
  Administered 2022-08-17: 5 mg via ORAL

## 2022-08-17 MED ORDER — LIDOCAINE HCL (CARDIAC) PF 100 MG/5ML IV SOSY
PREFILLED_SYRINGE | INTRAVENOUS | Status: DC | PRN
  Administered 2022-08-17: 100 mg via INTRAVENOUS

## 2022-08-17 MED ORDER — FENTANYL CITRATE (PF) 100 MCG/2ML IJ SOLN
INTRAMUSCULAR | Status: AC
  Filled 2022-08-17: qty 2

## 2022-08-17 MED ORDER — HYDROMORPHONE HCL 1 MG/ML IJ SOLN
0.2500 mg | INTRAMUSCULAR | Status: DC | PRN
  Administered 2022-08-17 (×2): 0.25 mg via INTRAVENOUS
  Administered 2022-08-17: 0.5 mg via INTRAVENOUS

## 2022-08-17 MED ORDER — OXYCODONE HCL 5 MG/5ML PO SOLN: 5.0000 mg | Freq: Once | ORAL | Status: AC | PRN

## 2022-08-17 MED ORDER — BUPIVACAINE HCL (PF) 0.5 % IJ SOLN
INTRAMUSCULAR | Status: AC
  Filled 2022-08-17: qty 30

## 2022-08-17 MED ORDER — 0.9 % SODIUM CHLORIDE (POUR BTL) OPTIME
TOPICAL | Status: DC | PRN
  Administered 2022-08-17: 500 mL

## 2022-08-17 MED ORDER — ACETAMINOPHEN 500 MG PO TABS
1000.0000 mg | ORAL_TABLET | ORAL | Status: AC
  Administered 2022-08-17: 1000 mg via ORAL

## 2022-08-17 MED ORDER — BUPIVACAINE LIPOSOME 1.3 % IJ SUSP
INTRAMUSCULAR | Status: DC | PRN
  Administered 2022-08-17: 50 mL

## 2022-08-17 MED ORDER — LACTATED RINGERS IV SOLN: INTRAVENOUS | Status: DC

## 2022-08-17 MED ORDER — ACETAMINOPHEN 500 MG PO TABS: 1000.0000 mg | ORAL_TABLET | Freq: Four times a day (QID) | ORAL | Status: AC | PRN

## 2022-08-17 MED ORDER — PROPOFOL 10 MG/ML IV BOLUS
INTRAVENOUS | Status: AC
  Filled 2022-08-17: qty 20

## 2022-08-17 MED ORDER — CHLORHEXIDINE GLUCONATE 0.12 % MT SOLN
15.0000 mL | Freq: Once | OROMUCOSAL | Status: AC
  Administered 2022-08-17: 15 mL via OROMUCOSAL

## 2022-08-17 MED ORDER — EPHEDRINE SULFATE (PRESSORS) 50 MG/ML IJ SOLN
INTRAMUSCULAR | Status: DC | PRN
  Administered 2022-08-17: 10 mg via INTRAVENOUS

## 2022-08-17 MED ORDER — GABAPENTIN 300 MG PO CAPS
300.0000 mg | ORAL_CAPSULE | ORAL | Status: AC
  Administered 2022-08-17: 300 mg via ORAL

## 2022-08-17 SURGICAL SUPPLY — 50 items
ADH SKN CLS APL DERMABOND .7 (GAUZE/BANDAGES/DRESSINGS) ×1
COVER TIP SHEARS 8 DVNC (MISCELLANEOUS) ×1 IMPLANT
COVER WAND RF STERILE (DRAPES) ×1 IMPLANT
DERMABOND ADVANCED .7 DNX12 (GAUZE/BANDAGES/DRESSINGS) ×1 IMPLANT
DRAPE ARM DVNC X/XI (DISPOSABLE) ×3 IMPLANT
DRAPE COLUMN DVNC XI (DISPOSABLE) ×1 IMPLANT
ELECT CAUTERY BLADE TIP 2.5 (TIP) ×1
ELECT REM PT RETURN 9FT ADLT (ELECTROSURGICAL) ×1
ELECTRODE CAUTERY BLDE TIP 2.5 (TIP) ×1 IMPLANT
ELECTRODE REM PT RTRN 9FT ADLT (ELECTROSURGICAL) ×1 IMPLANT
FORCEPS BPLR R/ABLATION 8 DVNC (INSTRUMENTS) ×1 IMPLANT
GLOVE SURG SYN 7.0 (GLOVE) ×2
GLOVE SURG SYN 7.0 PF PI (GLOVE) ×2 IMPLANT
GLOVE SURG SYN 7.5 E (GLOVE) ×2
GLOVE SURG SYN 7.5 PF PI (GLOVE) ×2 IMPLANT
GOWN STRL REUS W/ TWL LRG LVL3 (GOWN DISPOSABLE) ×4 IMPLANT
GOWN STRL REUS W/TWL LRG LVL3 (GOWN DISPOSABLE) ×5
IRRIGATION STRYKERFLOW (MISCELLANEOUS) ×1 IMPLANT
IRRIGATOR STRYKERFLOW (MISCELLANEOUS)
IV NS 1000ML (IV SOLUTION)
IV NS 1000ML BAXH (IV SOLUTION) IMPLANT
KIT PINK PAD W/HEAD ARE REST (MISCELLANEOUS) ×1
KIT PINK PAD W/HEAD ARM REST (MISCELLANEOUS) ×1 IMPLANT
LABEL OR SOLS (LABEL) ×1 IMPLANT
MANIFOLD NEPTUNE II (INSTRUMENTS) ×1 IMPLANT
MESH 3DMAX MID 4X6 LT LRG (Mesh General) IMPLANT
NDL DRIVE SUT CUT DVNC (INSTRUMENTS) ×1 IMPLANT
NDL HYPO 22X1.5 SAFETY MO (MISCELLANEOUS) ×1 IMPLANT
NDL INSUFFLATION 14GA 120MM (NEEDLE) ×1 IMPLANT
NEEDLE DRIVE SUT CUT DVNC (INSTRUMENTS) ×1
NEEDLE HYPO 22X1.5 SAFETY MO (MISCELLANEOUS) ×1
NEEDLE INSUFFLATION 14GA 120MM (NEEDLE) ×1
OBTURATOR OPTICAL STND 8 DVNC (TROCAR) ×1
OBTURATOR OPTICALSTD 8 DVNC (TROCAR) ×1 IMPLANT
PACK LAP CHOLECYSTECTOMY (MISCELLANEOUS) ×1 IMPLANT
PENCIL SMOKE EVACUATOR (MISCELLANEOUS) ×1 IMPLANT
SCISSORS MNPLR CVD DVNC XI (INSTRUMENTS) ×1 IMPLANT
SEAL UNIV 5-12 XI (MISCELLANEOUS) ×3 IMPLANT
SET TUBE SMOKE EVAC HIGH FLOW (TUBING) ×1 IMPLANT
SOL ELECTROSURG ANTI STICK (MISCELLANEOUS) ×1
SOLUTION ELECTROSURG ANTI STCK (MISCELLANEOUS) ×1 IMPLANT
SUT MNCRL AB 4-0 PS2 18 (SUTURE) ×1 IMPLANT
SUT VIC AB 2-0 SH 27 (SUTURE) ×1
SUT VIC AB 2-0 SH 27XBRD (SUTURE) ×2 IMPLANT
SUT VIC AB 3-0 SH 27 (SUTURE) ×1
SUT VIC AB 3-0 SH 27X BRD (SUTURE) IMPLANT
SUT VICRYL 0 UR6 27IN ABS (SUTURE) ×2 IMPLANT
SUT VLOC 90 S/L VL9 GS22 (SUTURE) ×1 IMPLANT
TAPE TRANSPORE STRL 2 31045 (GAUZE/BANDAGES/DRESSINGS) ×1 IMPLANT
TRAY FOLEY SLVR 16FR LF STAT (SET/KITS/TRAYS/PACK) ×1 IMPLANT

## 2022-08-17 NOTE — Discharge Instructions (Addendum)
Discharge Instructions: 1.  Patient may shower, but do not scrub wounds heavily and dab dry only. 2.  Do not submerge wounds in pool/tub until fully healed. 3.  Do not apply ointments or hydrogen peroxide to the wounds. 4.  May apply ice packs to the wounds for comfort. 5.  It is common for there to be puffiness/swelling/bruising in the groin area after surgery.  This will resolve on its own.  May wear a compression/sports underwear or tighter briefs to help put pressure in the area. 6.  May resume your Aspirin on 08/19/22. 7.  Do not drive while taking narcotics for pain control.  Prior to driving, make sure you are able to rotate right and left to look at blindspots without significant pain or discomfort. 8.  No heavy lifting or pushing of more than 10-15 lbs for 4 weeks.   AMBULATORY SURGERY  DISCHARGE INSTRUCTIONS   The drugs that you were given will stay in your system until tomorrow so for the next 24 hours you should not:  Drive an automobile Make any legal decisions Drink any alcoholic beverage   You may resume regular meals tomorrow.  Today it is better to start with liquids and gradually work up to solid foods.  You may eat anything you prefer, but it is better to start with liquids, then soup and crackers, and gradually work up to solid foods.   Please notify your doctor immediately if you have any unusual bleeding, trouble breathing, redness and pain at the surgery site, drainage, fever, or pain not relieved by medication.    Additional Instructions:   Please contact your physician with any problems or Same Day Surgery at [phone], Monday through Friday 6 am to 4 pm, or [place] [facility] number at [phone].

## 2022-08-17 NOTE — Op Note (Signed)
Procedure Date:  08/17/2022  Pre-operative Diagnosis:  Left inguinal hernia  Post-operative Diagnosis: Left indirect inguinal hernia  Procedure: 1.  Robotic assisted Left Inguinal Hernia Repair 2.  Creation of Left Posterior Rectus-Transversalis Fascia Advancment Flap for Coverage of Pelvic Wound (200 cm)  Surgeon:  Howie Ill, MD  Assistant:  Rosezena Sensor, PA-S  Anesthesia:  General endotracheal  Estimated Blood Loss:  15 ml  Specimens:  None  Complications:  None  Indications for Procedure:  This is a 72 y.o. male who presents with a left inguinal hernia.  The options of surgery versus observation were reviewed with the patient and/or family. The risks of bleeding, abscess or infection, recurrence of symptoms, potential for an open procedure, injury to surrounding structures, and chronic pain were all discussed with the patient and he was willing to proceed.  We have planned this transabdominal procedure with the creation of a left peritoneal flap based on the posterior rectus sheath and transversalis fascia in order to fully cover the mesh, creating a natural tisssue barrier for the bowel and peritoneal cavity.  Description of Procedure: The patient was correctly identified in the preoperative area and brought into the operating room.  The patient was placed supine with VTE prophylaxis in place.  Appropriate time-outs were performed.  Anesthesia was induced and the patient was intubated.  Foley catheter was placed.  Appropriate antibiotics were infused.  The abdomen was prepped and draped in a sterile fashion. A supraumbilical incision was made. A cutdown technique was used to enter the abdominal cavity without injury, and a Hasson trocar was inserted.  Pneumoperitoneum was obtained with appropriate opening pressures.  A Veress needle was used to start dissecting the peritoneal flap.  Two 8-mm robotic ports were placed in the right and left lateral positions under direct  visualization.  A large left Bard 3D Max Mid Mesh, a 2-0 Vicryl, and 2-0 vlock suture were placed through the umbilical port under direct visualization.  The [employer] platform was docked onto the patient, the camera was inserted and targeted, and the instruments were placed under direct visualization.  Both inguinal regions were inspected for hernias and it was confirmed that the patient had a left indirect inguinal hernia.  The right side did not have any evidence of hernia.  He did have adhesions of the omentum to the anterior pelvic wall, which were taken down using cautery.  Using electocautery, the peritoneal and posterior rectus tissue flap was created.  The peritoneum on the left side was scored from the median umbilical ligament laterally towards the ASIS.  The flap was mobilized using robotic scissors and the bipolar instruments, creating a plane along the posterior rectus sheath and transversalis fascia down to the pubic tubercle medially.  He had dense and thick adhesions medially due to his prior prostatectomy.  It was then further mobilized laterally across the inguinal canal and femoral vessels and onto the psoas muscle. The inferior epigastric vessels were identified and preserved. This created a posterior rectus and peritoneal flap measuring roughly 17 cm x 12 cm.  There was a small tear in the peritoneal flap inferiorly and medially due to the adhesions from his prostatectomy.  The hernia sac and contents were reduced preserving all structures.  A large left Bard 3D Max Mid mesh was placed with good overlap along all the potential hernia defects and secured in place with 2-0 Vicryl along the medial superomedial and superolateral aspects.  Then, the peritoneal flap was advanced over the  mesh and carried over to close the defect. A running 2-0 V lock suture was used to approximate the edge of the flap onto the peritoneum.  The tear in the peritoneum was repaired using 2-0 Vicryl.  All needles were  removed under direct visualization.  The 8- mm ports were removed under direct visualization and the Hasson trocar was removed.  The fascial opening was closed using 0 vicryl suture.  Local anesthetic was infused in all incisions as well as a left ilioinguinal block, and the incisions were closed with 4-0 Monocryl.  The wounds were cleaned and sealed with DermaBond.  Foley catheter was removed and the patient was emerged from anesthesia and extubated and brought to the recovery room for further management.  The patient tolerated the procedure well and all counts were correct at the end of the case.   Howie Ill, MD

## 2022-08-17 NOTE — Anesthesia Preprocedure Evaluation (Addendum)
Anesthesia Evaluation  Patient identified by MRN, date of birth, ID band Patient awake    Reviewed: Allergy & Precautions, NPO status , Patient's Chart, lab work & pertinent test results  History of Anesthesia Complications Negative for: history of anesthetic complications  Airway Mallampati: III  TM Distance: >3 FB Neck ROM: full    Dental no notable dental hx.    Pulmonary neg pulmonary ROS   Pulmonary exam normal        Cardiovascular hypertension, On Medications (-) angina + CAD and + Cardiac Stents  Normal cardiovascular exam+ dysrhythmias (RBBB)   Most recent TTE was performed on 05/14/2022 revealing a normal left ventricular systolic function with an EF of >55%.  There were no regional wall motion abnormalities.  Right ventricular size and function normal.  There was trivial to mild mitral, tricuspid, and pulmonary valve regurgitation.  All transvalvular gradients were noted to be normal providing no evidence suggestive of valvular stenosis.  Aorta normal in size with no evidence of aneurysmal dilatation.  Most recent myocardial perfusion imaging study was also performed on 05/24/2022 to further evaluate patient's complaints of chest tightness and shortness of breath.  Study revealed normal left ventricular systolic function with an EF of 58%.  There were no regional wall motion abnormalities.  There was no evidence of stress-induced myocardial ischemia or arrhythmia; no scintigraphic evidence of scar.  Study determined to be normal and low risk. Given the results of his recent noninvasive workup, the decision was made to defer cardiac catheterization opting for continued medical management unless patient's symptoms worsen. No changes were made to his medication regimen during his visit with cardiology.      Neuro/Psych  Neuromuscular disease (DDD)  negative psych ROS   GI/Hepatic Neg liver ROS,GERD  Controlled,,  Endo/Other   negative endocrine ROS    Renal/GU      Musculoskeletal   Abdominal   Peds  Hematology  (+) Blood dyscrasia, anemia   Anesthesia Other Findings Past Medical History: No date: Actinic keratosis No date: Anemia No date: Anginal pain (HCC) No date: Aortic atherosclerosis (HCC) 02/15/2019: Carotid stenosis, right     Comment:  a.) carotid doppler 02/15/2019: 50% RICA No date: Chronic insomnia     Comment:  a.) on hypnotic (zolpidem) PRN 03/1998: Coronary artery disease     Comment:  a.) s/p PTCA/PCI 04/15/1998: stents x2 (unknown type) to              [facility]; b.) MV 06/08/2018: no ischemia; c.) MV 05/14/2022:               no ischemia No date: DDD (degenerative disc disease), lumbar No date: Dyspnea on exertion No date: Elevated prostate specific antigen (PSA) No date: Erectile dysfunction     Comment:  a.) on PDE5i (sildenafil) No date: GERD (gastroesophageal reflux disease) No date: Hyperlipemia No date: Hypertension No date: Left inguinal hernia No date: Long term current use of aspirin No date: Lumbar spondylosis 2017: Prostate cancer (HCC)     Comment:  a.) s/p TURP No date: Prostatitis No date: RBBB No date: Skin cancer No date: Thoracic compression fracture (HCC) No date: Upper GI bleed No date: Vestibular neuritis     Comment:  suspected  Past Surgical History: No date: CATARACT EXTRACTION 03/27/2021: COLONOSCOPY WITH PROPOFOL; N/A     Comment:  Procedure: COLONOSCOPY WITH PROPOFOL;  Surgeon: Jaynie Collins, DO;  Location:  [facility] ENDOSCOPY;  Service:               Gastroenterology;  Laterality: N/A; 04/15/1998: CORONARY ANGIOPLASTY WITH STENT PLACEMENT; Left 08/04/2018: ESOPHAGOGASTRODUODENOSCOPY (EGD) WITH PROPOFOL; N/A     Comment:  Procedure: ESOPHAGOGASTRODUODENOSCOPY (EGD) WITH               PROPOFOL;  Surgeon: Christena Deem, MD;  Location:               [facility] ENDOSCOPY;  Service: Endoscopy;  Laterality: N/A; No date:  PROSTATECTOMY No date: RETINAL DETACHMENT SURGERY     Reproductive/Obstetrics negative OB ROS                             Anesthesia Physical Anesthesia Plan  ASA: 3  Anesthesia Plan: General ETT   Post-op Pain Management: Toradol IV (intra-op)* and Ofirmev IV (intra-op)*   Induction: Intravenous  PONV Risk Score and Plan: Ondansetron, Dexamethasone, Midazolam and Treatment may vary due to age or medical condition  Airway Management Planned: Oral ETT  Additional Equipment:   Intra-op Plan:   Post-operative Plan: Extubation in OR  Informed Consent: I have reviewed the patients History and Physical, chart, labs and discussed the procedure including the risks, benefits and alternatives for the proposed anesthesia with the patient or authorized representative who has indicated his/her understanding and acceptance.     Dental Advisory Given  Plan Discussed with: Anesthesiologist, CRNA and Surgeon  Anesthesia Plan Comments: (Patient consented for risks of anesthesia including but not limited to:  - adverse reactions to medications - damage to eyes, teeth, lips or other oral mucosa - nerve damage due to positioning  - sore throat or hoarseness - Damage to heart, brain, nerves, lungs, other parts of body or loss of life  Patient voiced understanding.)        Anesthesia Quick Evaluation

## 2022-08-17 NOTE — Interval H&P Note (Signed)
History and Physical Interval Note:  08/17/2022 12:46 PM  Terry Navarro.  has presented today for surgery, with the diagnosis of Left inguinal hernia.  The various methods of treatment have been discussed with the patient and family. After consideration of risks, benefits and other options for treatment, the patient has consented to  Procedure(s): XI ROBOTIC ASSISTED INGUINAL HERNIA REPAIR WITH MESH, possible open (Left) as a surgical intervention.  The patient's history has been reviewed, patient examined, no change in status, stable for surgery.  I have reviewed the patient's chart and labs.  Questions were answered to the patient's satisfaction.      

## 2022-08-17 NOTE — Progress Notes (Signed)
Ambulating and voided 100 mls. No c/o bladder discomfort. Tolerating po fluids. No c/o nausea. Pain minimal. Messaged Dr Aleen Campi with void amount. Okay to d/c to home

## 2022-08-17 NOTE — Anesthesia Procedure Notes (Signed)
Procedure Name: Intubation Date/Time: 08/17/2022 1:14 PM  Performed by: Cheral Bay, CRNAPre-anesthesia Checklist: Patient identified, Emergency Drugs available, Suction available and Patient being monitored Patient Re-evaluated:Patient Re-evaluated prior to induction Oxygen Delivery Method: Circle system utilized Preoxygenation: Pre-oxygenation with 100% oxygen Induction Type: IV induction Ventilation: Mask ventilation without difficulty Laryngoscope Size: McGraph and 4 Grade View: Grade II Tube type: Oral Number of attempts: 1 Airway Equipment and Method: Stylet Placement Confirmation: ETT inserted through vocal cords under direct vision, positive ETCO2 and breath sounds checked- equal and bilateral Secured at: 22 cm Tube secured with: Tape Dental Injury: Teeth and Oropharynx as per pre-operative assessment

## 2022-08-17 NOTE — Transfer of Care (Signed)
Immediate Anesthesia Transfer of Care Note  Patient: Terry Navarro.  Procedure(s) Performed: XI ROBOTIC ASSISTED INGUINAL HERNIA REPAIR WITH MESH (Left: Inguinal)  Patient Location: PACU  Anesthesia Type:General  Level of Consciousness: awake, drowsy, and patient cooperative  Airway & Oxygen Therapy: Patient Spontanous Breathing and Patient connected to face mask oxygen  Post-op Assessment: Report given to RN and Post -op Vital signs reviewed and stable  Post vital signs: Reviewed and stable  Last Vitals:  Vitals Value Taken Time  BP 138/72 08/17/22 1535  Temp 36.7 C 08/17/22 1535  Pulse 76 08/17/22 1537  Resp 25 08/17/22 1537  SpO2 100 % 08/17/22 1537  Vitals shown include unfiled device data.  Last Pain:  Vitals:   08/17/22 1215  TempSrc: Tympanic  PainSc: 0-No pain         Complications: No notable events documented.

## 2022-08-18 ENCOUNTER — Encounter: Payer: Self-pay | Admitting: Surgery

## 2022-08-24 NOTE — Anesthesia Postprocedure Evaluation (Signed)
Anesthesia Post Note  Patient: Terry Navarro.  Procedure(s) Performed: XI ROBOTIC ASSISTED INGUINAL HERNIA REPAIR WITH MESH (Left: Inguinal)  Patient location during evaluation: PACU Anesthesia Type: General Level of consciousness: awake and alert Pain management: pain level controlled Vital Signs Assessment: post-procedure vital signs reviewed and stable Respiratory status: spontaneous breathing, nonlabored ventilation, respiratory function stable and patient connected to nasal cannula oxygen Cardiovascular status: blood pressure returned to baseline and stable Postop Assessment: no apparent nausea or vomiting Anesthetic complications: no   No notable events documented.   Last Vitals:  Vitals:   08/17/22 1640 08/17/22 1648  BP:  (!) 142/74  Pulse: 78 85  Resp: (!) 24 17  Temp:  (!) 36.2 C  SpO2: 100% 100%    Last Pain:  Vitals:   08/17/22 1648  TempSrc: Temporal  PainSc: 3                  Yevette Edwards

## 2022-08-31 ENCOUNTER — Encounter: Payer: Self-pay | Admitting: Physician Assistant

## 2022-08-31 ENCOUNTER — Ambulatory Visit: Payer: PPO | Admitting: Physician Assistant

## 2022-08-31 VITALS — BP 122/74 | HR 62 | Temp 98.7°F | Ht 70.0 in | Wt 171.8 lb

## 2022-08-31 DIAGNOSIS — Z09 Encounter for follow-up examination after completed treatment for conditions other than malignant neoplasm: Secondary | ICD-10-CM

## 2022-08-31 DIAGNOSIS — K4090 Unilateral inguinal hernia, without obstruction or gangrene, not specified as recurrent: Secondary | ICD-10-CM

## 2022-08-31 NOTE — Progress Notes (Signed)
[facility] POST-OP OFFICE VISIT  08/31/2022  HPI: Terry Navarro. is a 72 y.o. male 14 days s/p robotic assisted laparoscopic left inguinal hernia repair with Dr Aleen Campi    He is doing well Had more soreness than anticipated but feels "almost back to normal" now No fever, chills, nausea, emesis, or bowel changes Incisions are well healed; did get ecchymosis to right lateral port site Ambulating well; back to work No other complaints   Vital signs: BP 122/74   Pulse 62   Temp 98.7 F (37.1 C) (Oral)   Ht 5\' 10"  (1.778 m)   Wt 171 lb 12.8 oz (77.9 kg)   SpO2 98%   BMI 24.65 kg/m    Physical Exam: Constitutional: Well appearing male, NAD Abdomen: Soft, non-tender, non-distended, no rebound/guarding Skin: Laparoscopic incisions are healing well, ecchymosis (healing) to right lateral port site, no erythema or drainage   Assessment/Plan: This is a 72 y.o. male 14 days s/p robotic assisted laparoscopic left inguinal hernia repair with Dr Aleen Campi     - Pain control prn  - Reviewed wound care recommendation  - Reviewed lifting restrictions; 6 weeks total  - He can follow up on as needed basis; He understands to call with questions/concerns  -- Lynden Oxford, PA-C [facility] 08/31/2022, 2:45 PM M-F: 7am - 4pm

## 2022-08-31 NOTE — Patient Instructions (Signed)

## 2022-09-10 DIAGNOSIS — M5136 Other intervertebral disc degeneration, lumbar region: Secondary | ICD-10-CM | POA: Diagnosis not present

## 2022-09-10 DIAGNOSIS — M48061 Spinal stenosis, lumbar region without neurogenic claudication: Secondary | ICD-10-CM | POA: Diagnosis not present

## 2022-09-15 DIAGNOSIS — Z79899 Other long term (current) drug therapy: Secondary | ICD-10-CM | POA: Diagnosis not present

## 2022-09-15 DIAGNOSIS — E782 Mixed hyperlipidemia: Secondary | ICD-10-CM | POA: Diagnosis not present

## 2022-09-15 DIAGNOSIS — I10 Essential (primary) hypertension: Secondary | ICD-10-CM | POA: Diagnosis not present

## 2022-09-15 DIAGNOSIS — Z125 Encounter for screening for malignant neoplasm of prostate: Secondary | ICD-10-CM | POA: Diagnosis not present

## 2022-09-21 DIAGNOSIS — R200 Anesthesia of skin: Secondary | ICD-10-CM | POA: Diagnosis not present

## 2022-09-21 DIAGNOSIS — R202 Paresthesia of skin: Secondary | ICD-10-CM | POA: Diagnosis not present

## 2022-09-23 DIAGNOSIS — D649 Anemia, unspecified: Secondary | ICD-10-CM | POA: Diagnosis not present

## 2022-09-23 DIAGNOSIS — I10 Essential (primary) hypertension: Secondary | ICD-10-CM | POA: Diagnosis not present

## 2022-09-23 DIAGNOSIS — E782 Mixed hyperlipidemia: Secondary | ICD-10-CM | POA: Diagnosis not present

## 2022-09-23 DIAGNOSIS — I2510 Atherosclerotic heart disease of native coronary artery without angina pectoris: Secondary | ICD-10-CM | POA: Diagnosis not present

## 2022-09-23 DIAGNOSIS — F5104 Psychophysiologic insomnia: Secondary | ICD-10-CM | POA: Diagnosis not present

## 2022-10-11 DIAGNOSIS — C4361 Malignant melanoma of right upper limb, including shoulder: Secondary | ICD-10-CM | POA: Diagnosis not present

## 2022-10-19 DIAGNOSIS — Z85828 Personal history of other malignant neoplasm of skin: Secondary | ICD-10-CM | POA: Diagnosis not present

## 2022-10-19 DIAGNOSIS — Z1283 Encounter for screening for malignant neoplasm of skin: Secondary | ICD-10-CM | POA: Diagnosis not present

## 2022-10-19 DIAGNOSIS — D229 Melanocytic nevi, unspecified: Secondary | ICD-10-CM | POA: Diagnosis not present

## 2022-10-19 DIAGNOSIS — L570 Actinic keratosis: Secondary | ICD-10-CM | POA: Diagnosis not present

## 2022-10-19 DIAGNOSIS — L814 Other melanin hyperpigmentation: Secondary | ICD-10-CM | POA: Diagnosis not present

## 2022-10-19 DIAGNOSIS — Z85820 Personal history of malignant melanoma of skin: Secondary | ICD-10-CM | POA: Diagnosis not present

## 2022-10-19 DIAGNOSIS — L820 Inflamed seborrheic keratosis: Secondary | ICD-10-CM | POA: Diagnosis not present

## 2022-10-19 DIAGNOSIS — L821 Other seborrheic keratosis: Secondary | ICD-10-CM | POA: Diagnosis not present

## 2022-10-20 ENCOUNTER — Encounter: Payer: Self-pay | Admitting: Surgery

## 2022-10-25 DIAGNOSIS — D649 Anemia, unspecified: Secondary | ICD-10-CM | POA: Diagnosis not present

## 2022-11-22 DIAGNOSIS — Z9889 Other specified postprocedural states: Secondary | ICD-10-CM | POA: Diagnosis not present

## 2022-11-22 DIAGNOSIS — R0602 Shortness of breath: Secondary | ICD-10-CM | POA: Diagnosis not present

## 2022-11-22 DIAGNOSIS — E782 Mixed hyperlipidemia: Secondary | ICD-10-CM | POA: Diagnosis not present

## 2022-11-22 DIAGNOSIS — I10 Essential (primary) hypertension: Secondary | ICD-10-CM | POA: Diagnosis not present

## 2022-12-27 DIAGNOSIS — Z79899 Other long term (current) drug therapy: Secondary | ICD-10-CM | POA: Diagnosis not present

## 2022-12-29 DIAGNOSIS — M7918 Myalgia, other site: Secondary | ICD-10-CM | POA: Diagnosis not present

## 2022-12-29 DIAGNOSIS — M9902 Segmental and somatic dysfunction of thoracic region: Secondary | ICD-10-CM | POA: Diagnosis not present

## 2022-12-29 DIAGNOSIS — M9904 Segmental and somatic dysfunction of sacral region: Secondary | ICD-10-CM | POA: Diagnosis not present

## 2022-12-29 DIAGNOSIS — M9903 Segmental and somatic dysfunction of lumbar region: Secondary | ICD-10-CM | POA: Diagnosis not present

## 2023-01-03 DIAGNOSIS — C4361 Malignant melanoma of right upper limb, including shoulder: Secondary | ICD-10-CM | POA: Diagnosis not present

## 2023-01-12 DIAGNOSIS — M7918 Myalgia, other site: Secondary | ICD-10-CM | POA: Diagnosis not present

## 2023-01-12 DIAGNOSIS — M9903 Segmental and somatic dysfunction of lumbar region: Secondary | ICD-10-CM | POA: Diagnosis not present

## 2023-01-12 DIAGNOSIS — M9902 Segmental and somatic dysfunction of thoracic region: Secondary | ICD-10-CM | POA: Diagnosis not present

## 2023-01-12 DIAGNOSIS — M9904 Segmental and somatic dysfunction of sacral region: Secondary | ICD-10-CM | POA: Diagnosis not present

## 2023-01-22 IMAGING — CT CT HEAD W/O CM
4 series · 16 of 47 positions shown, 18 images · non-contrast
Comparison: 03/30/2013

CLINICAL DATA: Nonspecific dizziness for 1 day

EXAM:
CT HEAD WITHOUT CONTRAST
TECHNIQUE: Contiguous axial images were obtained from the base of the skull
through the vertex without intravenous contrast.

[Series 2: head bone · axial · 0.44mm/px · z∈[+99,+131]mm · 3 of 80 slices shown]
[im 8/80  bone]
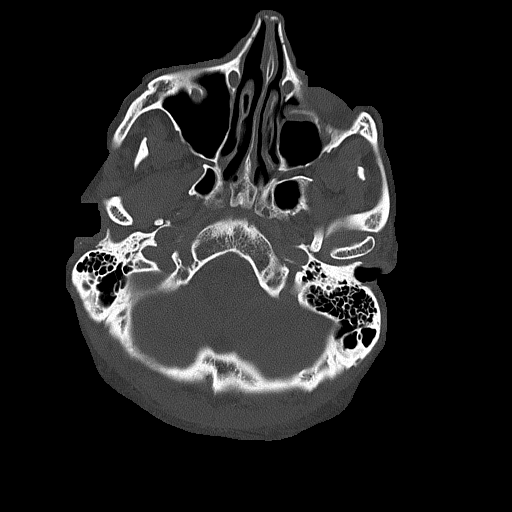
[im 16/80  bone]
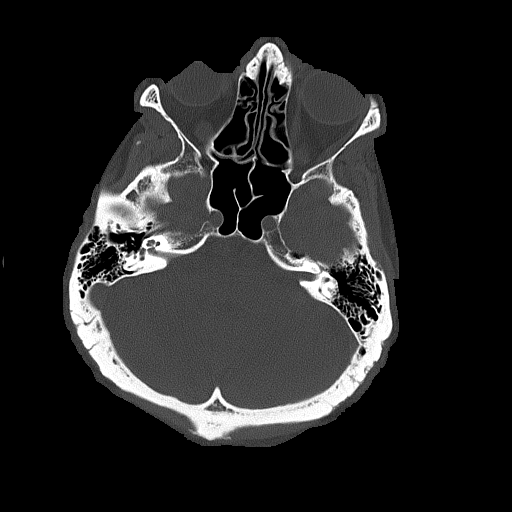
[im 24/80  bone]
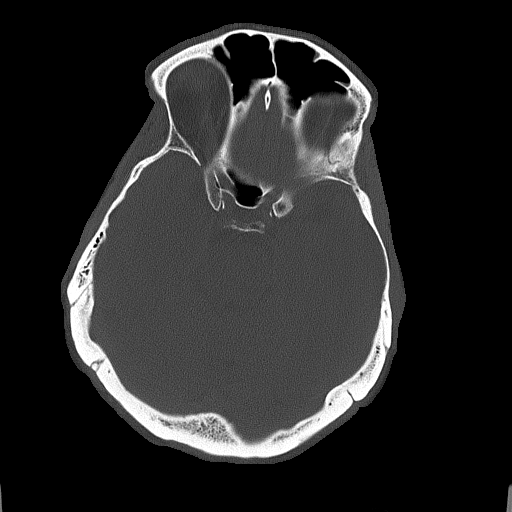

[Series 3: head wo · axial · 0.44mm/px · z∈[+100,+220]mm · 7 of 32 slices shown, 9 images]
[im 4/32  brain]
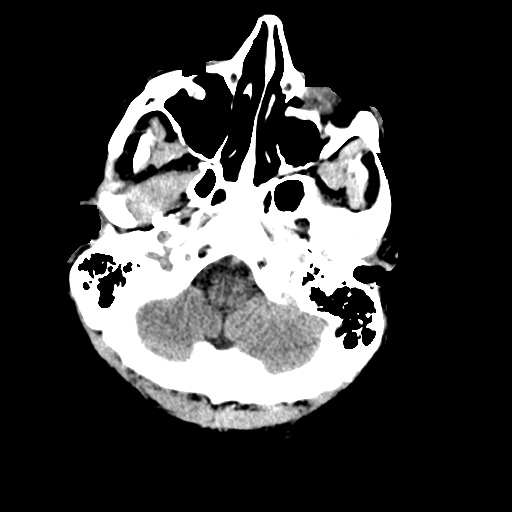
[im 4/32  bone]
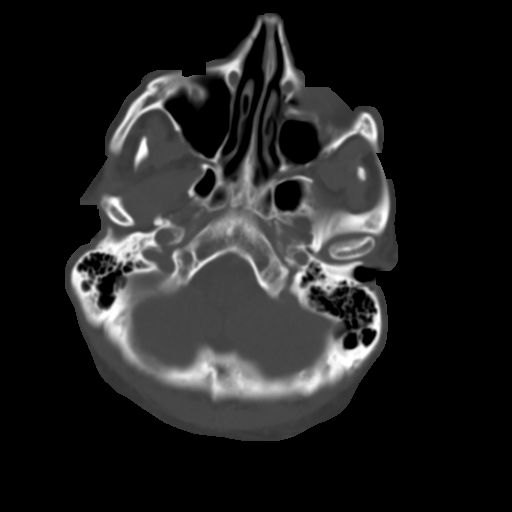
[im 8/32  brain]
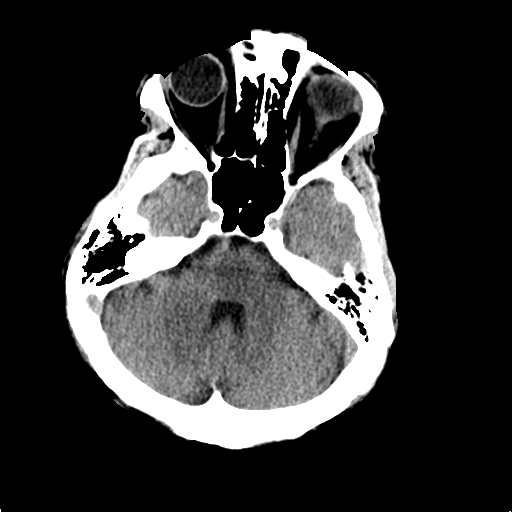
[im 12/32  brain]
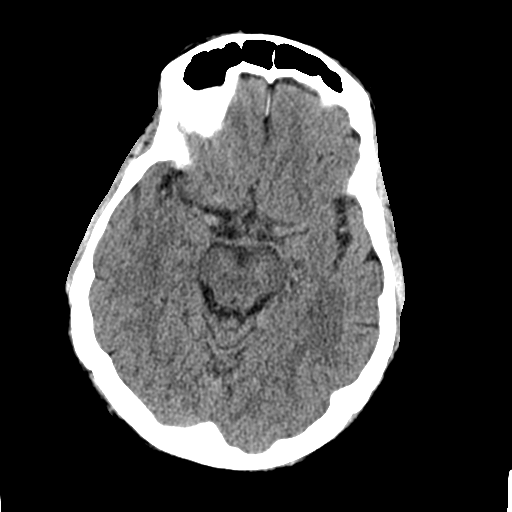
[im 16/32  brain]
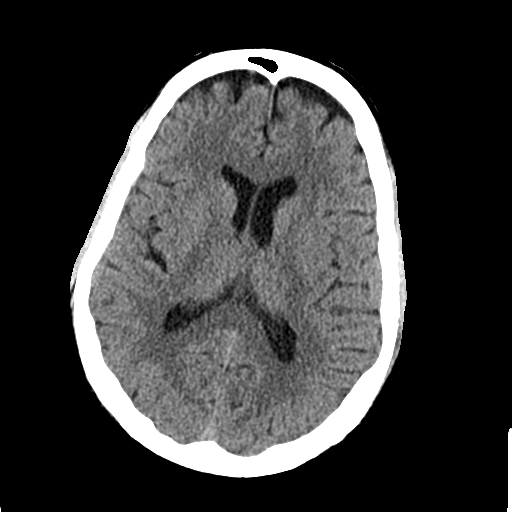
[im 20/32  brain]
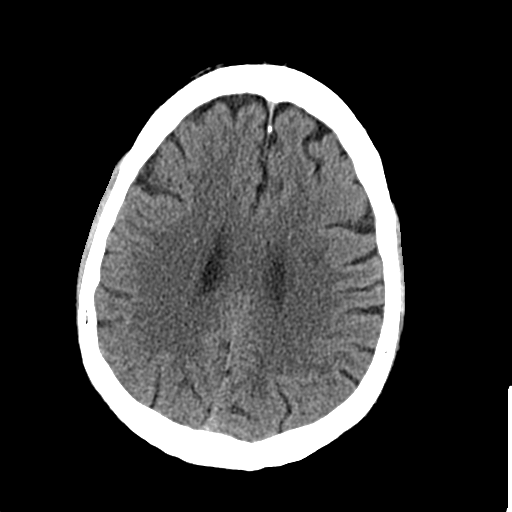
[im 20/32  bone]
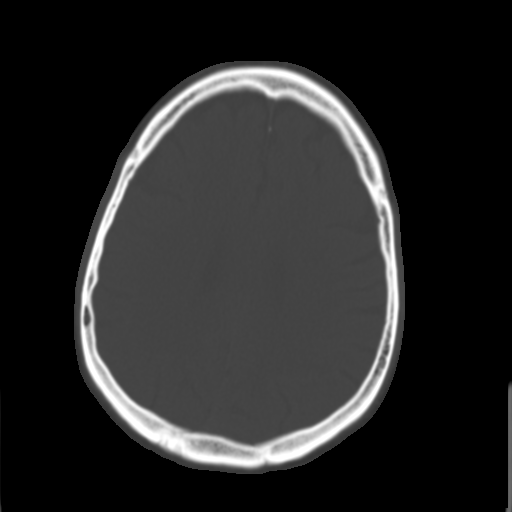
[im 24/32  brain]
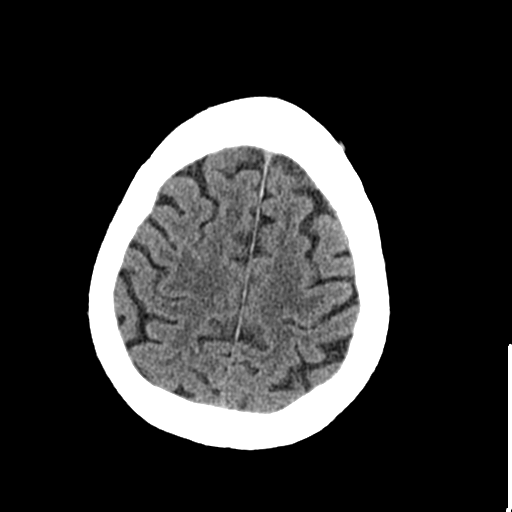
[im 28/32  brain]
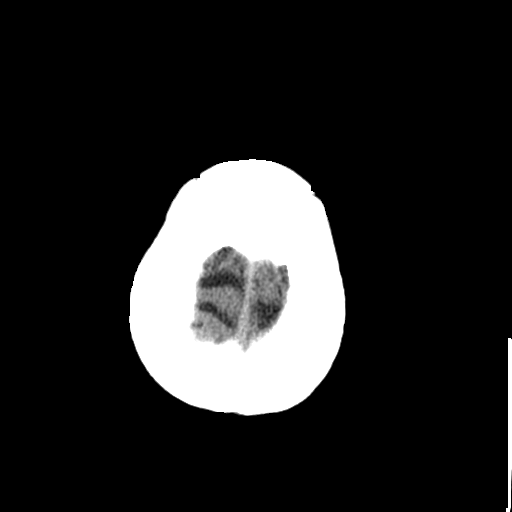

[Series 4: coronal soft tissue · coronal · 0.31mm/px · 3 of 71 slices shown]
[im 24/71  brain]
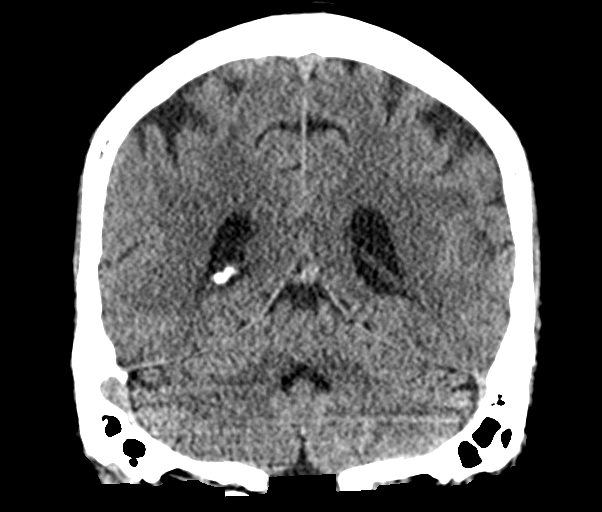
[im 32/71  brain]
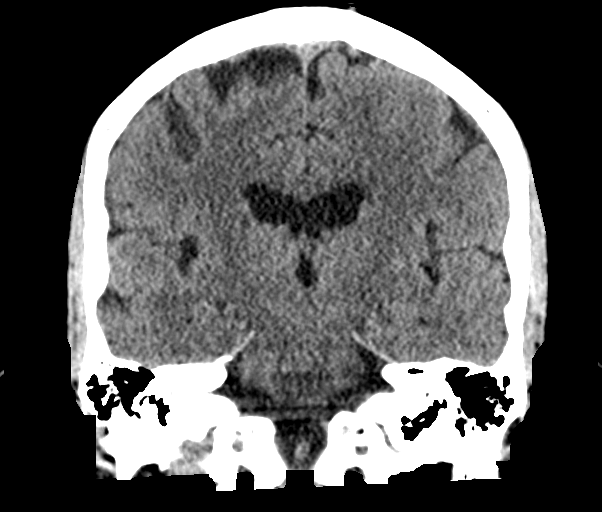
[im 39/71  brain]
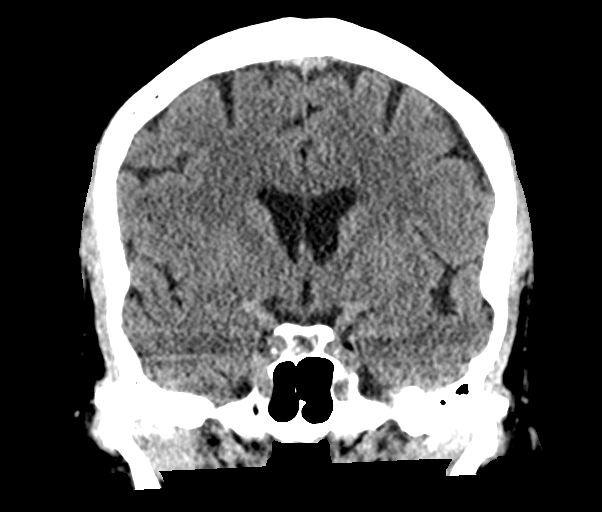

[Series 5: sagittal soft tissue · sagittal · 0.31mm/px · 3 of 63 slices shown]
[im 21/63  brain]
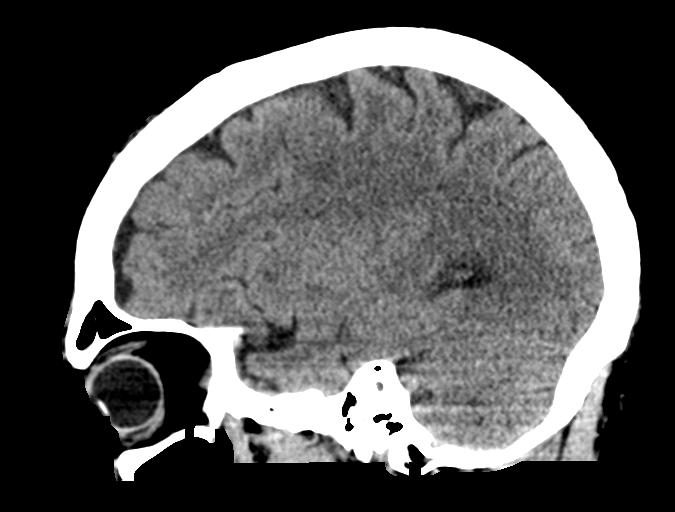
[im 32/63  brain]
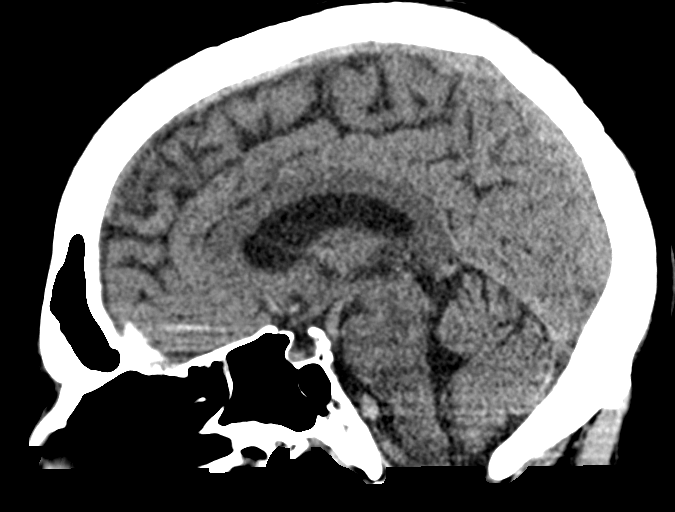
[im 42/63  brain]
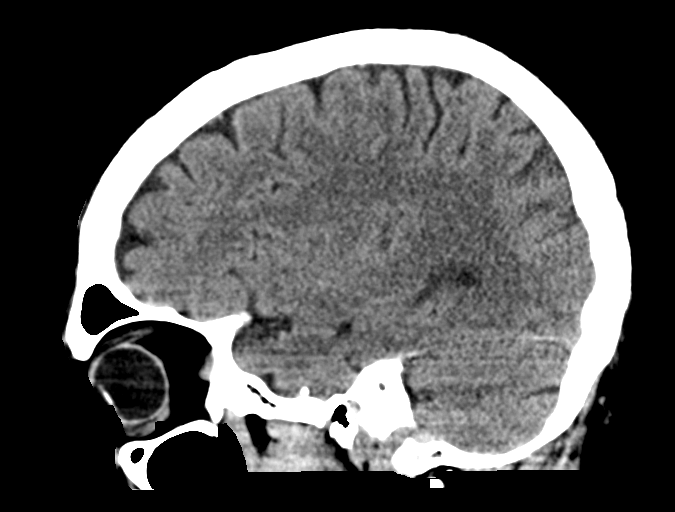

[16 of 47 positions shown; findings below may reference images not displayed]

FINDINGS: Brain: No evidence of acute infarction, hemorrhage, hydrocephalus,
extra-axial collection or mass lesion/mass effect.

Vascular: Atheromatous calcification

Skull: Normal. Negative for fracture or focal lesion.

Sinuses/Orbits: No acute finding.
IMPRESSION: Negative head CT.

## 2023-02-02 DIAGNOSIS — M9903 Segmental and somatic dysfunction of lumbar region: Secondary | ICD-10-CM | POA: Diagnosis not present

## 2023-02-02 DIAGNOSIS — M9904 Segmental and somatic dysfunction of sacral region: Secondary | ICD-10-CM | POA: Diagnosis not present

## 2023-02-02 DIAGNOSIS — M9902 Segmental and somatic dysfunction of thoracic region: Secondary | ICD-10-CM | POA: Diagnosis not present

## 2023-02-02 DIAGNOSIS — M7918 Myalgia, other site: Secondary | ICD-10-CM | POA: Diagnosis not present

## 2023-02-10 DIAGNOSIS — M51362 Other intervertebral disc degeneration, lumbar region with discogenic back pain and lower extremity pain: Secondary | ICD-10-CM | POA: Diagnosis not present

## 2023-02-10 DIAGNOSIS — R519 Headache, unspecified: Secondary | ICD-10-CM | POA: Diagnosis not present

## 2023-02-10 DIAGNOSIS — R490 Dysphonia: Secondary | ICD-10-CM | POA: Diagnosis not present

## 2023-02-10 DIAGNOSIS — R2689 Other abnormalities of gait and mobility: Secondary | ICD-10-CM | POA: Diagnosis not present

## 2023-02-10 DIAGNOSIS — F5101 Primary insomnia: Secondary | ICD-10-CM | POA: Diagnosis not present

## 2023-02-10 DIAGNOSIS — M25512 Pain in left shoulder: Secondary | ICD-10-CM | POA: Diagnosis not present

## 2023-02-10 DIAGNOSIS — H8120 Vestibular neuronitis, unspecified ear: Secondary | ICD-10-CM | POA: Diagnosis not present

## 2023-02-10 DIAGNOSIS — G609 Hereditary and idiopathic neuropathy, unspecified: Secondary | ICD-10-CM | POA: Diagnosis not present

## 2023-02-10 DIAGNOSIS — G8929 Other chronic pain: Secondary | ICD-10-CM | POA: Diagnosis not present

## 2023-02-11 DIAGNOSIS — G609 Hereditary and idiopathic neuropathy, unspecified: Secondary | ICD-10-CM | POA: Diagnosis not present

## 2023-02-11 DIAGNOSIS — H8120 Vestibular neuronitis, unspecified ear: Secondary | ICD-10-CM | POA: Diagnosis not present

## 2023-02-11 DIAGNOSIS — R490 Dysphonia: Secondary | ICD-10-CM | POA: Diagnosis not present

## 2023-02-11 DIAGNOSIS — G8929 Other chronic pain: Secondary | ICD-10-CM | POA: Diagnosis not present

## 2023-02-11 DIAGNOSIS — M51362 Other intervertebral disc degeneration, lumbar region with discogenic back pain and lower extremity pain: Secondary | ICD-10-CM | POA: Diagnosis not present

## 2023-02-11 DIAGNOSIS — F5101 Primary insomnia: Secondary | ICD-10-CM | POA: Diagnosis not present

## 2023-02-11 DIAGNOSIS — M25512 Pain in left shoulder: Secondary | ICD-10-CM | POA: Diagnosis not present

## 2023-02-11 DIAGNOSIS — R2689 Other abnormalities of gait and mobility: Secondary | ICD-10-CM | POA: Diagnosis not present

## 2023-02-11 DIAGNOSIS — R519 Headache, unspecified: Secondary | ICD-10-CM | POA: Diagnosis not present

## 2023-02-14 DIAGNOSIS — M7918 Myalgia, other site: Secondary | ICD-10-CM | POA: Diagnosis not present

## 2023-02-14 DIAGNOSIS — M9902 Segmental and somatic dysfunction of thoracic region: Secondary | ICD-10-CM | POA: Diagnosis not present

## 2023-02-14 DIAGNOSIS — M9904 Segmental and somatic dysfunction of sacral region: Secondary | ICD-10-CM | POA: Diagnosis not present

## 2023-02-14 DIAGNOSIS — M9903 Segmental and somatic dysfunction of lumbar region: Secondary | ICD-10-CM | POA: Diagnosis not present

## 2023-02-15 ENCOUNTER — Other Ambulatory Visit: Payer: Self-pay | Admitting: Student

## 2023-02-15 DIAGNOSIS — R519 Headache, unspecified: Secondary | ICD-10-CM

## 2023-02-22 ENCOUNTER — Ambulatory Visit
Admission: RE | Admit: 2023-02-22 | Discharge: 2023-02-22 | Disposition: A | Discharge status: Home / Self Care | Payer: PPO | Source: Ambulatory Visit | Attending: Student | Admitting: Student

## 2023-02-22 DIAGNOSIS — R519 Headache, unspecified: Secondary | ICD-10-CM | POA: Diagnosis not present

## 2023-02-23 DIAGNOSIS — M25512 Pain in left shoulder: Secondary | ICD-10-CM | POA: Diagnosis not present

## 2023-02-23 DIAGNOSIS — S43422A Sprain of left rotator cuff capsule, initial encounter: Secondary | ICD-10-CM | POA: Diagnosis not present

## 2023-03-01 DIAGNOSIS — M25512 Pain in left shoulder: Secondary | ICD-10-CM | POA: Diagnosis not present

## 2023-03-01 DIAGNOSIS — M6281 Muscle weakness (generalized): Secondary | ICD-10-CM | POA: Diagnosis not present

## 2023-03-01 DIAGNOSIS — M25612 Stiffness of left shoulder, not elsewhere classified: Secondary | ICD-10-CM | POA: Diagnosis not present

## 2023-03-02 DIAGNOSIS — M9902 Segmental and somatic dysfunction of thoracic region: Secondary | ICD-10-CM | POA: Diagnosis not present

## 2023-03-02 DIAGNOSIS — M9904 Segmental and somatic dysfunction of sacral region: Secondary | ICD-10-CM | POA: Diagnosis not present

## 2023-03-02 DIAGNOSIS — M7918 Myalgia, other site: Secondary | ICD-10-CM | POA: Diagnosis not present

## 2023-03-02 DIAGNOSIS — M9903 Segmental and somatic dysfunction of lumbar region: Secondary | ICD-10-CM | POA: Diagnosis not present

## 2023-03-04 DIAGNOSIS — M6281 Muscle weakness (generalized): Secondary | ICD-10-CM | POA: Diagnosis not present

## 2023-03-04 DIAGNOSIS — M25612 Stiffness of left shoulder, not elsewhere classified: Secondary | ICD-10-CM | POA: Diagnosis not present

## 2023-03-04 DIAGNOSIS — M25512 Pain in left shoulder: Secondary | ICD-10-CM | POA: Diagnosis not present

## 2023-03-07 DIAGNOSIS — K224 Dyskinesia of esophagus: Secondary | ICD-10-CM | POA: Diagnosis not present

## 2023-03-07 DIAGNOSIS — K219 Gastro-esophageal reflux disease without esophagitis: Secondary | ICD-10-CM | POA: Diagnosis not present

## 2023-03-07 DIAGNOSIS — K5909 Other constipation: Secondary | ICD-10-CM | POA: Diagnosis not present

## 2023-03-07 DIAGNOSIS — R0789 Other chest pain: Secondary | ICD-10-CM | POA: Diagnosis not present

## 2023-03-08 DIAGNOSIS — M25612 Stiffness of left shoulder, not elsewhere classified: Secondary | ICD-10-CM | POA: Diagnosis not present

## 2023-03-08 DIAGNOSIS — M25512 Pain in left shoulder: Secondary | ICD-10-CM | POA: Diagnosis not present

## 2023-03-08 DIAGNOSIS — M6281 Muscle weakness (generalized): Secondary | ICD-10-CM | POA: Diagnosis not present

## 2023-03-17 DIAGNOSIS — M25512 Pain in left shoulder: Secondary | ICD-10-CM | POA: Diagnosis not present

## 2023-03-17 DIAGNOSIS — M6281 Muscle weakness (generalized): Secondary | ICD-10-CM | POA: Diagnosis not present

## 2023-03-17 DIAGNOSIS — M25612 Stiffness of left shoulder, not elsewhere classified: Secondary | ICD-10-CM | POA: Diagnosis not present

## 2023-03-18 DIAGNOSIS — H26493 Other secondary cataract, bilateral: Secondary | ICD-10-CM | POA: Diagnosis not present

## 2023-03-18 DIAGNOSIS — H1045 Other chronic allergic conjunctivitis: Secondary | ICD-10-CM | POA: Diagnosis not present

## 2023-03-18 DIAGNOSIS — H35372 Puckering of macula, left eye: Secondary | ICD-10-CM | POA: Diagnosis not present

## 2023-03-18 DIAGNOSIS — H5213 Myopia, bilateral: Secondary | ICD-10-CM | POA: Diagnosis not present

## 2023-03-22 DIAGNOSIS — Z79899 Other long term (current) drug therapy: Secondary | ICD-10-CM | POA: Diagnosis not present

## 2023-03-22 DIAGNOSIS — Z1283 Encounter for screening for malignant neoplasm of skin: Secondary | ICD-10-CM | POA: Diagnosis not present

## 2023-03-22 DIAGNOSIS — Z85820 Personal history of malignant melanoma of skin: Secondary | ICD-10-CM | POA: Diagnosis not present

## 2023-03-22 DIAGNOSIS — D229 Melanocytic nevi, unspecified: Secondary | ICD-10-CM | POA: Diagnosis not present

## 2023-03-22 DIAGNOSIS — L821 Other seborrheic keratosis: Secondary | ICD-10-CM | POA: Diagnosis not present

## 2023-03-22 DIAGNOSIS — E782 Mixed hyperlipidemia: Secondary | ICD-10-CM | POA: Diagnosis not present

## 2023-03-22 DIAGNOSIS — Z125 Encounter for screening for malignant neoplasm of prostate: Secondary | ICD-10-CM | POA: Diagnosis not present

## 2023-03-25 DIAGNOSIS — M6281 Muscle weakness (generalized): Secondary | ICD-10-CM | POA: Diagnosis not present

## 2023-03-25 DIAGNOSIS — M25512 Pain in left shoulder: Secondary | ICD-10-CM | POA: Diagnosis not present

## 2023-03-25 DIAGNOSIS — M25612 Stiffness of left shoulder, not elsewhere classified: Secondary | ICD-10-CM | POA: Diagnosis not present

## 2023-03-28 DIAGNOSIS — M25612 Stiffness of left shoulder, not elsewhere classified: Secondary | ICD-10-CM | POA: Diagnosis not present

## 2023-03-28 DIAGNOSIS — M25512 Pain in left shoulder: Secondary | ICD-10-CM | POA: Diagnosis not present

## 2023-03-28 DIAGNOSIS — M6281 Muscle weakness (generalized): Secondary | ICD-10-CM | POA: Diagnosis not present

## 2023-03-28 DIAGNOSIS — R0602 Shortness of breath: Secondary | ICD-10-CM | POA: Diagnosis not present

## 2023-03-28 DIAGNOSIS — I2510 Atherosclerotic heart disease of native coronary artery without angina pectoris: Secondary | ICD-10-CM | POA: Diagnosis not present

## 2023-03-28 DIAGNOSIS — Z79899 Other long term (current) drug therapy: Secondary | ICD-10-CM | POA: Diagnosis not present

## 2023-03-28 DIAGNOSIS — Z125 Encounter for screening for malignant neoplasm of prostate: Secondary | ICD-10-CM | POA: Diagnosis not present

## 2023-03-28 DIAGNOSIS — N411 Chronic prostatitis: Secondary | ICD-10-CM | POA: Diagnosis not present

## 2023-03-28 DIAGNOSIS — E782 Mixed hyperlipidemia: Secondary | ICD-10-CM | POA: Diagnosis not present

## 2023-03-28 DIAGNOSIS — D508 Other iron deficiency anemias: Secondary | ICD-10-CM | POA: Diagnosis not present

## 2023-03-28 DIAGNOSIS — R0609 Other forms of dyspnea: Secondary | ICD-10-CM | POA: Diagnosis not present

## 2023-03-28 DIAGNOSIS — Z0000 Encounter for general adult medical examination without abnormal findings: Secondary | ICD-10-CM | POA: Diagnosis not present

## 2023-03-28 DIAGNOSIS — I10 Essential (primary) hypertension: Secondary | ICD-10-CM | POA: Diagnosis not present

## 2023-04-01 DIAGNOSIS — M25612 Stiffness of left shoulder, not elsewhere classified: Secondary | ICD-10-CM | POA: Diagnosis not present

## 2023-04-01 DIAGNOSIS — M25512 Pain in left shoulder: Secondary | ICD-10-CM | POA: Diagnosis not present

## 2023-04-01 DIAGNOSIS — M6281 Muscle weakness (generalized): Secondary | ICD-10-CM | POA: Diagnosis not present

## 2023-06-02 DIAGNOSIS — Z9889 Other specified postprocedural states: Secondary | ICD-10-CM | POA: Diagnosis not present

## 2023-06-02 DIAGNOSIS — R0602 Shortness of breath: Secondary | ICD-10-CM | POA: Diagnosis not present

## 2023-06-02 DIAGNOSIS — I10 Essential (primary) hypertension: Secondary | ICD-10-CM | POA: Diagnosis not present

## 2023-06-02 DIAGNOSIS — E782 Mixed hyperlipidemia: Secondary | ICD-10-CM | POA: Diagnosis not present

## 2023-08-01 DIAGNOSIS — H538 Other visual disturbances: Secondary | ICD-10-CM | POA: Diagnosis not present

## 2023-08-01 DIAGNOSIS — H93A2 Pulsatile tinnitus, left ear: Secondary | ICD-10-CM | POA: Diagnosis not present

## 2023-08-01 DIAGNOSIS — H8120 Vestibular neuronitis, unspecified ear: Secondary | ICD-10-CM | POA: Diagnosis not present

## 2023-08-01 DIAGNOSIS — G609 Hereditary and idiopathic neuropathy, unspecified: Secondary | ICD-10-CM | POA: Diagnosis not present

## 2023-08-04 ENCOUNTER — Other Ambulatory Visit: Payer: Self-pay

## 2023-08-04 DIAGNOSIS — M5030 Other cervical disc degeneration, unspecified cervical region: Secondary | ICD-10-CM

## 2023-08-04 DIAGNOSIS — R42 Dizziness and giddiness: Secondary | ICD-10-CM

## 2023-08-04 DIAGNOSIS — R2689 Other abnormalities of gait and mobility: Secondary | ICD-10-CM

## 2023-08-09 ENCOUNTER — Ambulatory Visit
Admission: RE | Admit: 2023-08-09 | Discharge: 2023-08-09 | Disposition: A | Discharge status: Home / Self Care | Source: Ambulatory Visit

## 2023-08-09 ENCOUNTER — Other Ambulatory Visit: Payer: Self-pay

## 2023-08-09 DIAGNOSIS — R2689 Other abnormalities of gait and mobility: Secondary | ICD-10-CM | POA: Insufficient documentation

## 2023-08-09 DIAGNOSIS — M4802 Spinal stenosis, cervical region: Secondary | ICD-10-CM | POA: Diagnosis not present

## 2023-08-09 DIAGNOSIS — M5030 Other cervical disc degeneration, unspecified cervical region: Secondary | ICD-10-CM | POA: Insufficient documentation

## 2023-08-09 DIAGNOSIS — M50223 Other cervical disc displacement at C6-C7 level: Secondary | ICD-10-CM | POA: Diagnosis not present

## 2023-08-09 DIAGNOSIS — R42 Dizziness and giddiness: Secondary | ICD-10-CM | POA: Insufficient documentation

## 2023-08-09 DIAGNOSIS — H538 Other visual disturbances: Secondary | ICD-10-CM

## 2023-08-09 DIAGNOSIS — I6523 Occlusion and stenosis of bilateral carotid arteries: Secondary | ICD-10-CM

## 2023-08-11 ENCOUNTER — Ambulatory Visit
Admission: RE | Admit: 2023-08-11 | Discharge: 2023-08-11 | Disposition: A | Discharge status: Home / Self Care | Source: Ambulatory Visit

## 2023-08-11 DIAGNOSIS — I6523 Occlusion and stenosis of bilateral carotid arteries: Secondary | ICD-10-CM | POA: Insufficient documentation

## 2023-08-11 DIAGNOSIS — H538 Other visual disturbances: Secondary | ICD-10-CM | POA: Insufficient documentation

## 2023-09-20 DIAGNOSIS — D1801 Hemangioma of skin and subcutaneous tissue: Secondary | ICD-10-CM | POA: Diagnosis not present

## 2023-09-20 DIAGNOSIS — Z85820 Personal history of malignant melanoma of skin: Secondary | ICD-10-CM | POA: Diagnosis not present

## 2023-09-20 DIAGNOSIS — L570 Actinic keratosis: Secondary | ICD-10-CM | POA: Diagnosis not present

## 2023-09-21 DIAGNOSIS — Z125 Encounter for screening for malignant neoplasm of prostate: Secondary | ICD-10-CM | POA: Diagnosis not present

## 2023-09-21 DIAGNOSIS — E782 Mixed hyperlipidemia: Secondary | ICD-10-CM | POA: Diagnosis not present

## 2023-09-21 DIAGNOSIS — Z79899 Other long term (current) drug therapy: Secondary | ICD-10-CM | POA: Diagnosis not present

## 2023-09-28 DIAGNOSIS — K5909 Other constipation: Secondary | ICD-10-CM | POA: Diagnosis not present

## 2023-09-28 DIAGNOSIS — Z125 Encounter for screening for malignant neoplasm of prostate: Secondary | ICD-10-CM | POA: Diagnosis not present

## 2023-09-28 DIAGNOSIS — I2510 Atherosclerotic heart disease of native coronary artery without angina pectoris: Secondary | ICD-10-CM | POA: Diagnosis not present

## 2023-09-28 DIAGNOSIS — N411 Chronic prostatitis: Secondary | ICD-10-CM | POA: Diagnosis not present

## 2023-09-28 DIAGNOSIS — Z79899 Other long term (current) drug therapy: Secondary | ICD-10-CM | POA: Diagnosis not present

## 2023-09-28 DIAGNOSIS — I10 Essential (primary) hypertension: Secondary | ICD-10-CM | POA: Diagnosis not present

## 2023-09-28 DIAGNOSIS — E782 Mixed hyperlipidemia: Secondary | ICD-10-CM | POA: Diagnosis not present

## 2023-09-28 DIAGNOSIS — F5104 Psychophysiologic insomnia: Secondary | ICD-10-CM | POA: Diagnosis not present

## 2023-10-13 DIAGNOSIS — R413 Other amnesia: Secondary | ICD-10-CM | POA: Diagnosis not present

## 2023-10-13 DIAGNOSIS — H818X9 Other disorders of vestibular function, unspecified ear: Secondary | ICD-10-CM | POA: Diagnosis not present

## 2023-10-13 DIAGNOSIS — M47812 Spondylosis without myelopathy or radiculopathy, cervical region: Secondary | ICD-10-CM | POA: Diagnosis not present

## 2023-10-13 DIAGNOSIS — R519 Headache, unspecified: Secondary | ICD-10-CM | POA: Diagnosis not present

## 2023-10-13 DIAGNOSIS — I6523 Occlusion and stenosis of bilateral carotid arteries: Secondary | ICD-10-CM | POA: Diagnosis not present

## 2023-12-20 DIAGNOSIS — I10 Essential (primary) hypertension: Secondary | ICD-10-CM | POA: Diagnosis not present

## 2023-12-20 DIAGNOSIS — E782 Mixed hyperlipidemia: Secondary | ICD-10-CM | POA: Diagnosis not present

## 2023-12-20 DIAGNOSIS — R0602 Shortness of breath: Secondary | ICD-10-CM | POA: Diagnosis not present

## 2023-12-20 DIAGNOSIS — R001 Bradycardia, unspecified: Secondary | ICD-10-CM | POA: Diagnosis not present

## 2023-12-20 DIAGNOSIS — Z9889 Other specified postprocedural states: Secondary | ICD-10-CM | POA: Diagnosis not present

## 2023-12-20 DIAGNOSIS — Z9861 Coronary angioplasty status: Secondary | ICD-10-CM | POA: Diagnosis not present

## 2023-12-20 DIAGNOSIS — Z23 Encounter for immunization: Secondary | ICD-10-CM | POA: Diagnosis not present

## 2024-01-03 DIAGNOSIS — R001 Bradycardia, unspecified: Secondary | ICD-10-CM | POA: Diagnosis not present
